# Patient Record
Sex: Male | Born: 1995 | Race: White | Hispanic: No | Smoking: Current some day smoker
Health system: Southern US, Community
[De-identification: ages and names within clinical notes are randomized; demographics above are authoritative.]

## PROBLEM LIST (undated history)

## (undated) DIAGNOSIS — F32A Depression, unspecified: Secondary | ICD-10-CM

## (undated) DIAGNOSIS — F329 Major depressive disorder, single episode, unspecified: Secondary | ICD-10-CM

## (undated) DIAGNOSIS — F1120 Opioid dependence, uncomplicated: Secondary | ICD-10-CM

---
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
## 2015-02-14 ENCOUNTER — Encounter (HOSPITAL_COMMUNITY): Payer: Self-pay | Admitting: *Deleted

## 2015-02-14 ENCOUNTER — Emergency Department (HOSPITAL_COMMUNITY)
Admission: EM | Admit: 2015-02-14 | Discharge: 2015-02-14 | Disposition: A | Discharge status: Home / Self Care | Payer: Self-pay | Attending: Emergency Medicine | Admitting: Emergency Medicine

## 2015-02-14 DIAGNOSIS — Z720 Tobacco use: Secondary | ICD-10-CM | POA: Insufficient documentation

## 2015-02-14 DIAGNOSIS — Y998 Other external cause status: Secondary | ICD-10-CM | POA: Insufficient documentation

## 2015-02-14 DIAGNOSIS — Y9289 Other specified places as the place of occurrence of the external cause: Secondary | ICD-10-CM | POA: Insufficient documentation

## 2015-02-14 DIAGNOSIS — S3992XA Unspecified injury of lower back, initial encounter: Secondary | ICD-10-CM | POA: Insufficient documentation

## 2015-02-14 DIAGNOSIS — S022XXA Fracture of nasal bones, initial encounter for closed fracture: Secondary | ICD-10-CM | POA: Insufficient documentation

## 2015-02-14 DIAGNOSIS — Y9389 Activity, other specified: Secondary | ICD-10-CM | POA: Insufficient documentation

## 2015-02-14 MED ORDER — IBUPROFEN 200 MG PO TABS
ORAL_TABLET | ORAL | Status: AC
  Filled 2015-02-14: qty 3

## 2015-02-14 MED ORDER — IBUPROFEN 200 MG PO TABS
600.0000 mg | ORAL_TABLET | Freq: Once | ORAL | Status: AC
  Administered 2015-02-14: 600 mg via ORAL
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
## 2018-01-17 ENCOUNTER — Other Ambulatory Visit: Payer: Self-pay

## 2018-01-17 ENCOUNTER — Encounter: Payer: Self-pay | Admitting: Emergency Medicine

## 2018-01-17 DIAGNOSIS — K130 Diseases of lips: Secondary | ICD-10-CM | POA: Insufficient documentation

## 2018-01-17 DIAGNOSIS — F17200 Nicotine dependence, unspecified, uncomplicated: Secondary | ICD-10-CM | POA: Insufficient documentation

## 2018-01-17 NOTE — ED Triage Notes (Signed)
Patient ambulatory to triage with steady gait, without difficulty or distress noted; pt reports that he popped a zit on his lower lip 2 days ago, now with swelling

## 2018-01-18 ENCOUNTER — Emergency Department
Admission: EM | Admit: 2018-01-18 | Discharge: 2018-01-18 | Disposition: A | Discharge status: Home / Self Care | Payer: Self-pay | Attending: Emergency Medicine | Admitting: Emergency Medicine

## 2018-01-18 DIAGNOSIS — K130 Diseases of lips: Secondary | ICD-10-CM

## 2018-01-18 MED ORDER — CEFTRIAXONE SODIUM 1 G IJ SOLR
1.0000 g | Freq: Once | INTRAMUSCULAR | Status: AC
  Administered 2018-01-18: 1 g via INTRAMUSCULAR
  Filled 2018-01-18: qty 10

## 2018-01-18 MED ORDER — SULFAMETHOXAZOLE-TRIMETHOPRIM 800-160 MG PO TABS: 1.0000 | ORAL_TABLET | Freq: Two times a day (BID) | ORAL | 0 refills | Status: DC

## 2018-01-18 MED ORDER — LIDOCAINE HCL (PF) 1 % IJ SOLN
1.2000 mL | Freq: Once | INTRAMUSCULAR | Status: AC
  Administered 2018-01-18: 1.2 mL
  Filled 2018-01-18: qty 5

## 2018-01-18 MED ORDER — NAPROXEN 500 MG PO TABS: 500.0000 mg | ORAL_TABLET | Freq: Two times a day (BID) | ORAL | 0 refills | Status: DC

## 2018-01-18 MED ORDER — NAPROXEN 500 MG PO TABS
500.0000 mg | ORAL_TABLET | Freq: Once | ORAL | Status: AC
  Administered 2018-01-18: 500 mg via ORAL
  Filled 2018-01-18: qty 1

## 2018-01-18 NOTE — Discharge Instructions (Signed)
Return to the ER for symptoms that are not improving over the next 2 days.

## 2018-01-18 NOTE — ED Provider Notes (Signed)
[facility] Emergency Department Provider Note  ____________________________________________  Time seen: Approximately 0146 PM  I have reviewed the triage vital signs and the nursing notes.   HISTORY  Chief Complaint Oral Swelling   HPI James Holloway is a 22 y.o. male who presents to the emergency department for treatment and evaluation of lower lip swelling.  He states that he had a pimple on his lower lip a couple of days ago and he "popped it."  He noticed that his lower lip was swollen yesterday but over the past 24 hours it has significantly worsened.  No specific intervention has been attempted prior to arrival.  No known fever.   History reviewed. No pertinent past medical history.  There are no active problems to display for this patient.   History reviewed. No pertinent surgical history.  Prior to Admission medications   Medication Sig Start Date End Date Taking? Authorizing Provider  ibuprofen (ADVIL,MOTRIN) 200 MG tablet Take 600-800 mg by mouth every 6 (six) hours as needed for headache, mild pain or moderate pain.    [provider]  naproxen (NAPROSYN) 500 MG tablet Take 1 tablet (500 mg total) by mouth 2 (two) times daily with a meal. 01/18/18   Adelis Docter B, FNP  sulfamethoxazole-trimethoprim (BACTRIM DS,SEPTRA DS) 800-160 MG tablet Take 1 tablet by mouth 2 (two) times daily. 01/18/18   Chinita Pester, FNP    Allergies Patient has no known allergies.  No family history on file.  Social History Social History   Tobacco Use  . Smoking status: Current Some Day Smoker  . Smokeless tobacco: Never Used  Substance Use Topics  . Alcohol use: Yes  . Drug use: No    Review of Systems  Constitutional: Negative for fever. Respiratory: Negative for cough or shortness of breath.  Musculoskeletal: Negative for myalgias Skin: Positive for lower lip swelling. Neurological: Negative for numbness or  paresthesias. ____________________________________________   PHYSICAL EXAM:  VITAL SIGNS: ED Triage Vitals  Enc Vitals Group     BP 01/17/18 2251 114/75     Pulse Rate 01/17/18 2251 100     Resp 01/17/18 2251 17     Temp 01/17/18 2251 98.9 F (37.2 C)     Temp Source 01/17/18 2251 Oral     SpO2 01/17/18 2251 97 %     Weight 01/17/18 2252 180 lb (81.6 kg)     Height 01/17/18 2252 6\' 1"  (1.854 m)     Head Circumference --      Peak Flow --      Pain Score 01/17/18 2253 9     Pain Loc --      Pain Edu? --      Excl. in GC? --      Constitutional: Well appearing. Eyes: Conjunctivae are clear without discharge or drainage. Nose: No rhinorrhea noted. Mouth/Throat: Airway is patent.  Neck: No stridor. Unrestricted range of motion observed. Cardiovascular: Capillary refill is <3 seconds.  Respiratory: Respirations are even and unlabored.. Musculoskeletal: Unrestricted range of motion observed. Neurologic: Awake, alert, and oriented x 4.  Skin: Lower lip is indurated without any area of fluctuance.  Scabbed lesion just below the lip is noted  ____________________________________________   LABS (all labs ordered are listed, but only abnormal results are displayed)  Labs Reviewed - No data to display ____________________________________________  EKG  Not indicated. ____________________________________________  RADIOLOGY  Not indicated ____________________________________________   PROCEDURES  Procedures ____________________________________________   INITIAL IMPRESSION / ASSESSMENT AND  PLAN / ED COURSE  Cristopher EstimableChristopher J Holloway is a 22 y.o. male who presents to the emergency department for treatment and evaluation of lower lip swelling.  While in the emergency department, he was given an injection of Rocephin and then will be discharged home with a prescription for Bactrim and Naprosyn.  Patient was strongly encouraged to return to the emergency department  immediately if he feels that the swelling is spreading or if he begins to have any difficulty talking, swallowing, or breathing.   Medications  cefTRIAXone (ROCEPHIN) injection 1 g (1 g Intramuscular Given 01/18/18 0127)  lidocaine (PF) (XYLOCAINE) 1 % injection 1.2 mL (1.2 mLs Other Given 01/18/18 0127)  naproxen (NAPROSYN) tablet 500 mg (500 mg Oral Given 01/18/18 0127)     Pertinent labs & imaging results that were available during my care of the patient were reviewed by me and considered in my medical decision making (see chart for details).  ____________________________________________   FINAL CLINICAL IMPRESSION(S) / ED DIAGNOSES  Final diagnoses:  Cellulitis of skin of lip    ED Discharge Orders        Ordered    sulfamethoxazole-trimethoprim (BACTRIM DS,SEPTRA DS) 800-160 MG tablet  2 times daily     01/18/18 0113    naproxen (NAPROSYN) 500 MG tablet  2 times daily with meals     01/18/18 0113       Note:  This document was prepared using Dragon voice recognition software and may include unintentional dictation errors.    Chinita Pesterriplett, Elize Pinon B, FNP 01/18/18 2025    Rebecka ApleyWebster, Allison P, MD 01/23/18 2330

## 2018-03-21 ENCOUNTER — Emergency Department
Admission: EM | Admit: 2018-03-21 | Discharge: 2018-03-21 | Disposition: A | Discharge status: Home / Self Care | Payer: Self-pay | Attending: Emergency Medicine | Admitting: Emergency Medicine

## 2018-03-21 ENCOUNTER — Other Ambulatory Visit: Payer: Self-pay

## 2018-03-21 ENCOUNTER — Encounter: Payer: Self-pay | Admitting: Emergency Medicine

## 2018-03-21 DIAGNOSIS — Y999 Unspecified external cause status: Secondary | ICD-10-CM | POA: Insufficient documentation

## 2018-03-21 DIAGNOSIS — X17XXXA Contact with hot engines, machinery and tools, initial encounter: Secondary | ICD-10-CM | POA: Insufficient documentation

## 2018-03-21 DIAGNOSIS — T310 Burns involving less than 10% of body surface: Secondary | ICD-10-CM | POA: Insufficient documentation

## 2018-03-21 DIAGNOSIS — Y929 Unspecified place or not applicable: Secondary | ICD-10-CM | POA: Insufficient documentation

## 2018-03-21 DIAGNOSIS — T23161A Burn of first degree of back of right hand, initial encounter: Secondary | ICD-10-CM | POA: Insufficient documentation

## 2018-03-21 DIAGNOSIS — Y9389 Activity, other specified: Secondary | ICD-10-CM | POA: Insufficient documentation

## 2018-03-21 DIAGNOSIS — F17210 Nicotine dependence, cigarettes, uncomplicated: Secondary | ICD-10-CM | POA: Insufficient documentation

## 2018-03-21 MED ORDER — SILVER SULFADIAZINE 1 % EX CREA: TOPICAL_CREAM | CUTANEOUS | 0 refills | Status: DC

## 2018-03-21 MED ORDER — LIDOCAINE 4 % EX CREA
TOPICAL_CREAM | Freq: Once | CUTANEOUS | Status: AC
  Administered 2018-03-21: 23:00:00 via TOPICAL
  Filled 2018-03-21: qty 5

## 2018-03-21 MED ORDER — TRAMADOL HCL 50 MG PO TABS: 50.0000 mg | ORAL_TABLET | Freq: Two times a day (BID) | ORAL | 0 refills | Status: DC
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
## 2018-03-21 NOTE — Discharge Instructions (Addendum)
Keep the hand clean, dry, and covered. Change the dressing daily. Take the pain medicine if needed. Consider using an OTC lidocaine cream for topical pain relief. [facility] for [facility].
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
## 2018-07-12 ENCOUNTER — Other Ambulatory Visit: Payer: Self-pay

## 2018-07-12 ENCOUNTER — Emergency Department: Payer: Self-pay

## 2018-07-12 ENCOUNTER — Emergency Department
Admission: EM | Admit: 2018-07-12 | Discharge: 2018-07-12 | Disposition: A | Discharge status: Home / Self Care | Payer: Self-pay | Attending: Emergency Medicine | Admitting: Emergency Medicine

## 2018-07-12 DIAGNOSIS — F1920 Other psychoactive substance dependence, uncomplicated: Secondary | ICD-10-CM | POA: Insufficient documentation

## 2018-07-12 DIAGNOSIS — F17200 Nicotine dependence, unspecified, uncomplicated: Secondary | ICD-10-CM | POA: Insufficient documentation

## 2018-07-12 DIAGNOSIS — T401X1A Poisoning by heroin, accidental (unintentional), initial encounter: Secondary | ICD-10-CM | POA: Insufficient documentation

## 2018-07-12 LAB — URINE DRUG SCREEN, QUALITATIVE (ARMC ONLY)
Amphetamines, Ur Screen: NOT DETECTED
BENZODIAZEPINE, UR SCRN: NOT DETECTED
Barbiturates, Ur Screen: NOT DETECTED
Cannabinoid 50 Ng, Ur ~~LOC~~: NOT DETECTED
Cocaine Metabolite,Ur ~~LOC~~: NOT DETECTED
MDMA (Ecstasy)Ur Screen: NOT DETECTED
METHADONE SCREEN, URINE: NOT DETECTED
Opiate, Ur Screen: POSITIVE — AB
Phencyclidine (PCP) Ur S: NOT DETECTED
TRICYCLIC, UR SCREEN: NOT DETECTED

## 2018-07-12 LAB — COMPREHENSIVE METABOLIC PANEL
ALT: 19 U/L (ref 0–44)
ANION GAP: 6 (ref 5–15)
AST: 29 U/L (ref 15–41)
Albumin: 4.2 g/dL (ref 3.5–5.0)
Alkaline Phosphatase: 86 U/L (ref 38–126)
BILIRUBIN TOTAL: 0.6 mg/dL (ref 0.3–1.2)
BUN: 14 mg/dL (ref 6–20)
CO2: 28 mmol/L (ref 22–32)
Calcium: 8.1 mg/dL — ABNORMAL LOW (ref 8.9–10.3)
Chloride: 105 mmol/L (ref 98–111)
Creatinine, Ser: 1.05 mg/dL (ref 0.61–1.24)
GFR calc Af Amer: >60 mL/min (ref >60)
Glucose, Bld: 161 mg/dL — ABNORMAL HIGH (ref 70–99)
Potassium: 3.3 mmol/L — ABNORMAL LOW (ref 3.5–5.1)
Sodium: 139 mmol/L (ref 135–145)
TOTAL PROTEIN: 7.2 g/dL (ref 6.5–8.1)

## 2018-07-12 LAB — ACETAMINOPHEN LEVEL

## 2018-07-12 LAB — CBC
HCT: 45.3 % (ref 39.0–52.0)
Hemoglobin: 14.8 g/dL (ref 13.0–17.0)
MCH: 29.4 pg (ref 26.0–34.0)
MCHC: 32.7 g/dL (ref 30.0–36.0)
MCV: 90.1 fL (ref 80.0–100.0)
Platelets: 213 10*3/uL (ref 150–400)
RBC: 5.03 MIL/uL (ref 4.22–5.81)
RDW: 13.2 % (ref 11.5–15.5)
WBC: 11.3 10*3/uL — ABNORMAL HIGH (ref 4.0–10.5)
nRBC: 0 % (ref 0.0–0.2)

## 2018-07-12 LAB — ETHANOL: Alcohol, Ethyl (B): <10 mg/dL (ref <10)

## 2018-07-12 LAB — SALICYLATE LEVEL

## 2018-07-12 LAB — GLUCOSE, CAPILLARY: Glucose-Capillary: 150 mg/dL — ABNORMAL HIGH (ref 70–99)

## 2018-07-12 MED ORDER — NALOXONE HCL 0.4 MG/0.4ML IJ SOAJ: INTRAMUSCULAR | 1 refills | Status: DC

## 2018-07-12 MED ORDER — NALOXONE HCL 4 MG/0.1ML NA LIQD
2.0000 | Freq: Once | NASAL | Status: AC
  Administered 2018-07-12: 2 via NASAL
  Filled 2018-07-12: qty 4

## 2018-07-12 MED ORDER — ACETAMINOPHEN 500 MG PO TABS
1000.0000 mg | ORAL_TABLET | Freq: Once | ORAL | Status: AC
  Administered 2018-07-12: 1000 mg via ORAL
  Filled 2018-07-12: qty 2

## 2018-07-12 MED ORDER — SODIUM CHLORIDE 0.9 % IV SOLN
Freq: Once | INTRAVENOUS | Status: AC
  Administered 2018-07-12: 16:00:00 via INTRAVENOUS

## 2018-07-12 MED ORDER — NALOXONE HCL 4 MG/0.1ML NA LIQD
NASAL | Status: AC
  Filled 2018-07-12: qty 4
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
## 2018-07-12 NOTE — ED Provider Notes (Signed)
[facility] Emergency Department Provider Note  ____________________________________________  Time seen: Approximately 3:49 PM  I have reviewed the triage vital signs and the nursing notes.   HISTORY  Chief Complaint Drug Overdose    HPI James Holloway is a 22 y.o. male with no significant past medical history who is brought to the ED after an apparent heroin overdose.  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  However, today he relapsed for the first time.  He was found unresponsive by his father who performed CPR in the home prior to first responders arriving.  On their arrival, they found the patient to have a pulse with a respiratory rate of 6 and tachycardia.  No Narcan was given.  He was found with a needle in his arm according to report.  The patient denies any medical complaints preceding this event.  He denies any chest pain or shortness of breath or palpitations.  He reports that he feels dehydrated and "sluggish".      History reviewed. No pertinent past medical history.   There are no active problems to display for this patient.    History reviewed. No pertinent surgical history.   Prior to Admission medications   Medication Sig Start Date End Date Taking? Authorizing Provider  Naloxone HCl 0.4 MG/0.4ML SOAJ Follow package instructions for heroin or opioid overdose. 07/12/18   Sharman Cheek, MD  silver sulfADIAZINE (SILVADENE) 1 % cream Apply to affected area daily 03/21/18   Menshew, Charlesetta Ivory, PA-C  traMADol (ULTRAM) 50 MG tablet Take 1 tablet (50 mg total) by mouth 2 (two) times daily. 03/21/18   Menshew, Charlesetta Ivory, PA-C     Allergies Patient has no known allergies.   No family history on file.  Social History Social History   Tobacco  Use  . Smoking status: Current Some Day Smoker  . Smokeless tobacco: Never Used  Substance Use Topics  . Alcohol use: Never    Frequency: Never  . Drug use: Yes    Types: IV    Comment: heroin    Review of Systems  Constitutional:   No fever or chills.  Cardiovascular:   No chest pain or syncope. Respiratory:   No dyspnea or cough. Gastrointestinal:   Negative for abdominal pain, vomiting and diarrhea.  Musculoskeletal:   Negative for focal pain or swelling All other systems reviewed and are negative except as documented above in ROS and HPI.  ____________________________________________   PHYSICAL EXAM:  VITAL SIGNS: ED Triage Vitals  Enc Vitals Group     BP 07/12/18 1515 (!) 149/86     Pulse Rate 07/12/18 1515 (!) 123     Resp 07/12/18 1515 13     Temp 07/12/18 1518 98.7 F (37.1 C)     Temp Source 07/12/18 1518 Oral     SpO2 07/12/18 1515 93 %     Weight 07/12/18 1514 160 lb (72.6 kg)     Height 07/12/18 1514 6\' 1"  (1.854 m)     Head Circumference --      Peak Flow --      Pain Score 07/12/18 1514 0     Pain Loc --      Pain Edu? --      Excl. in GC? --     Vital signs reviewed, nursing assessments  reviewed.   Constitutional:   Alert and oriented. Non-toxic appearance. Eyes:   Conjunctivae are normal. EOMI. PERRL. ENT      Head:   Normocephalic and atraumatic.      Nose:   No congestion/rhinnorhea.       Mouth/Throat:   Dry mucous membranes, no pharyngeal erythema. No peritonsillar mass.       Neck:   No meningismus. Full ROM. Hematological/Lymphatic/Immunilogical:   No cervical lymphadenopathy. Cardiovascular:   Tachycardia heart rate 105. Symmetric bilateral radial and DP pulses.  No murmurs. Cap refill less than 2 seconds. Respiratory:   Normal respiratory effort without tachypnea/retractions. Breath sounds are clear and equal bilaterally. No wheezes/rales/rhonchi. Gastrointestinal:   Soft and nontender. Non distended. There is no CVA tenderness.  No  rebound, rigidity, or guarding. Musculoskeletal:   Normal range of motion in all extremities. No joint effusions.  No lower extremity tenderness.  No edema.  No inflammatory changes or tenderness in the area of injection. Neurologic:   Normal speech and language.  Motor grossly intact. No acute focal neurologic deficits are appreciated.  Skin:    Skin is warm, dry and intact.  Long track marks on the right forearm from previous injections.  No rash noted.  No petechiae, purpura, or bullae.  ____________________________________________    LABS (pertinent positives/negatives) (all labs ordered are listed, but only abnormal results are displayed) Labs Reviewed  COMPREHENSIVE METABOLIC PANEL - Abnormal; Notable for the following components:      Result Value   Potassium 3.3 (*)    Glucose, Bld 161 (*)    Calcium 8.1 (*)    All other components within normal limits  ACETAMINOPHEN LEVEL - Abnormal; Notable for the following components:   Acetaminophen (Tylenol), Serum <10 (*)    All other components within normal limits  CBC - Abnormal; Notable for the following components:   WBC 11.3 (*)    All other components within normal limits  URINE DRUG SCREEN, QUALITATIVE ([facility] ONLY) - Abnormal; Notable for the following components:   Opiate, Ur Screen POSITIVE (*)    All other components within normal limits  GLUCOSE, CAPILLARY - Abnormal; Notable for the following components:   Glucose-Capillary 150 (*)    All other components within normal limits  ETHANOL  SALICYLATE LEVEL  CBG MONITORING, ED   ____________________________________________   EKG  Interpreted by me Sinus tachycardia rate 108, right axis, normal intervals.  Normal QRS ST segments and T waves.  ____________________________________________    RADIOLOGY  Dg Chest 2 View  Result Date: 07/12/2018 CLINICAL DATA:  22 year old who sustained a heroin overdose, found unresponsive at home. Respiratory rate of 6 and  tachycardia with a heart rate of 130-140 upon EMS arrival. EXAM: CHEST - 2 VIEW COMPARISON:  None. FINDINGS: Cardiac silhouette upper normal in size to mildly enlarged for patient age. Hilar and mediastinal contours otherwise unremarkable. Lungs clear. Bronchovascular markings normal. Pulmonary vascularity normal. No visible pleural effusions. No pneumothorax. Visualized bony thorax intact. IMPRESSION: Borderline to mild cardiomegaly for patient age. No acute cardiopulmonary disease. Electronically Signed   By: Hulan Saas M.D.   On: 07/12/2018 16:23    ____________________________________________   PROCEDURES Procedures  ____________________________________________    CLINICAL IMPRESSION / ASSESSMENT AND PLAN / ED COURSE  Pertinent labs & imaging results that were available during my care of the patient were reviewed by me and considered in my medical decision making (see chart for details).    Patient presents after an apparent heroin overdose.  Reportedly he had a potential cardiac arrest requiring CPR resuscitation in home.  Currently denies any complaints.  He is forthcoming and is clear that this is a accidental overdose without any intent to harm himself.  Poses no specific danger to himself and is not committable at this time.  Check labs and chest x-ray, IV fluids for hydration, will monitor in the ED pending work-up.  ----------------------------------------- 7:47 PM on 07/12/2018 -----------------------------------------  Work-up chest x-ray vital signs all unremarkable.  Heart rate normalized after IV fluids.  On reassessment patient is calm and comfortable.  Given the presentation I doubt a true cardiac arrest, and doubt the patient would be increased risk for dysrhythmia.  Will provide naloxone to take home if available, otherwise a prescription.  Patient indicates that he will follow-up with [facility] to restart Suboxone therapy tomorrow.  Patient informed he is always  welcome to return to the emergency room if he has any safety concerns or other symptoms.      ____________________________________________   FINAL CLINICAL IMPRESSION(S) / ED DIAGNOSES    Final diagnoses:  Accidental overdose of heroin, initial encounter [facility](HCC)     ED Discharge Orders         Ordered    Naloxone HCl 0.4 MG/0.4ML SOAJ     07/12/18 1947          Portions of this note were generated with dragon dictation software. Dictation errors may occur despite best attempts at proofreading.    Sharman CheekStafford, Danella Philson, MD 07/12/18 1949

## 2018-07-12 NOTE — BH Assessment (Signed)
Assessment Note  James EstimableChristopher J Holloway is an 22 y.o. male who presents to ED after being found by his father unresponsive after overdosing from heroin. Pt adamantly denies this overdose being intentional. He reports past SA treatment with [facility] in September 2019. He reports previous MAT with [facility]. Pt is not currently interested in inpatient detox Tx. He is willing to engage in SA outpatient services. TTS provided pt with community resources.   Diagnosis: Opioid Use Disorder, Severe  Past Medical History: History reviewed. No pertinent past medical history.  History reviewed. No pertinent surgical history.  Family History: No family history on file.  Social History:  reports that he has been smoking. He has never used smokeless tobacco. He reports that he has current or past drug history. Drug: IV. He reports that he does not drink alcohol.  Additional Social History:  Alcohol / Drug Use Pain Medications: See MAR Prescriptions: See MAR Over the Counter: See MAR History of alcohol / drug use?: Yes Longest period of sobriety (when/how long): Unable to report Negative Consequences of Use: Personal relationships Substance #1 Name of Substance 1: Heroin 1 - Age of First Use: Unable to recall 1 - Amount (size/oz): "just a needle filled" 1 - Frequency: Unable to Quantify 1 - Duration: Unable to Quantify 1 - Last Use / Amount: 07/12/2018  CIWA: CIWA-Ar BP: 130/80 Pulse Rate: 86 COWS:    Allergies: No Known Allergies  Home Medications:  (Not in a hospital admission)  OB/GYN Status:  No LMP for male patient.  General Assessment Data [facility] Assessment: In system Is this a Tele or Face-to-Face Assessment?: Face-to-Face Is this an Initial Assessment or a Re-assessment for this encounter?: Initial Assessment Patient Accompanied by:: N/A Language Other than English: No Living Arrangements: Other (Comment) What gender do you identify as?:  Male Marital status: Single Pregnancy Status: No Living Arrangements: Alone Can pt return to current living arrangement?: Yes Admission Status: Voluntary Is patient capable of signing voluntary admission?: Yes Referral Source: Self/Family/Friend Insurance type: None  Medical Screening Exam [facility]-in ONLY) Medical Exam completed: Yes  Crisis Care Plan Living Arrangements: Alone Legal Guardian: Other:(Self) Name of Psychiatrist: None Name of Therapist: None  Education Status Is patient currently in school?: No Is the patient employed, unemployed or receiving disability?: Unemployed  Risk to self with the past 6 months Suicidal Ideation: No Has patient been a risk to self within the past 6 months prior to admission? : No Suicidal Intent: No Has patient had any suicidal intent within the past 6 months prior to admission? : No Is patient at risk for suicide?: No Suicidal Plan?: No Has patient had any suicidal plan within the past 6 months prior to admission? : No Access to Means: No What has been your use of drugs/alcohol within the last 12 months?: Heroin Previous Attempts/Gestures: No How many times?: 0 Other Self Harm Risks: None Triggers for Past Attempts: None known Intentional Self Injurious Behavior: None Family Suicide History: No Recent stressful life event(s): (Active Addiction) Persecutory voices/beliefs?: No Depression: Yes Depression Symptoms: Insomnia Substance abuse history and/or treatment for substance abuse?: Yes Suicide prevention information given to non-admitted patients: Not applicable  Risk to Others within the past 6 months Homicidal Ideation: No Does patient have any lifetime risk of violence toward others beyond the six months prior to admission? : No Thoughts of Harm to Others: No Current Homicidal Intent: No Current Homicidal Plan: No Access to Homicidal Means: No Identified  Victim: None History of harm to others?: No Assessment of  Violence: None Noted Violent Behavior Description: None Does patient have access to weapons?: No Criminal Charges Pending?: No Does patient have a court date: No Is patient on probation?: No  Psychosis Hallucinations: None noted Delusions: None noted  Mental Status Report Appearance/Hygiene: (Street clothes) Eye Contact: Good Motor Activity: Freedom of movement Speech: Logical/coherent Level of Consciousness: Other (Comment)("Cloudy") Mood: Depressed Affect: Flat Anxiety Level: Minimal Thought Processes: Coherent, Relevant Judgement: Partial Orientation: Person, Time, Place, Situation, Appropriate for developmental age Obsessive Compulsive Thoughts/Behaviors: None  Cognitive Functioning Concentration: Normal Memory: Recent Intact, Remote Intact Is patient IDD: No Insight: Fair Impulse Control: Fair Appetite: Good Have you had any weight changes? : No Change Sleep: Decreased Total Hours of Sleep: 5 Vegetative Symptoms: None  ADLScreening [facility] Assessment Services) Patient's cognitive ability adequate to safely complete daily activities?: Yes Patient able to express need for assistance with ADLs?: Yes Independently performs ADLs?: Yes (appropriate for developmental age)  Prior Inpatient Therapy Prior Inpatient Therapy: No  Prior Outpatient Therapy Prior Outpatient Therapy: Yes Prior Therapy Dates: September 2019 Prior Therapy Facilty/Provider(s): [facility] Reason for Treatment: Detox Does patient have an ACCT team?: No Does patient have Intensive In-House Services?  : No Does patient have [facility] services? : No Does patient have P4CC services?: No  ADL Screening (condition at time of admission) Patient's cognitive ability adequate to safely complete daily activities?: Yes Patient able to express need for assistance with ADLs?: Yes Independently performs ADLs?: Yes (appropriate for developmental age)       Abuse/Neglect Assessment (Assessment to be  complete while patient is alone) Abuse/Neglect Assessment Can Be Completed: Yes Physical Abuse: Denies Verbal Abuse: Denies Sexual Abuse: Denies Exploitation of patient/patient's resources: Denies Self-Neglect: Denies Values / Beliefs Cultural Requests During Hospitalization: None Spiritual Requests During Hospitalization: None Consults Spiritual Care Consult Needed: No Social Work Consult Needed: No Merchant navy officer (For Healthcare) Does Patient Have a Medical Advance Directive?: No Would patient like information on creating a medical advance directive?: No - Patient declined       Child/Adolescent Assessment Running Away Risk: (Patient is an adult)  Disposition:  Disposition Initial Assessment Completed for this Encounter: Yes Disposition of Patient: Discharge Patient refused recommended treatment: No Mode of transportation if patient is discharged/movement?: Car Patient referred to: Outpatient clinic referral  On Site Evaluation by:   Reviewed with Physician:    Wilmon Arms 07/12/2018 7:33 PM

## 2018-07-12 NOTE — Discharge Instructions (Addendum)
You should avoid any heroin use whatsoever, but as a precaution, you should also keep naloxone nasal spray or injection in an accessible location in your home.  This will allow friends or family to rescue you in the event of a future overdose.

## 2018-07-12 NOTE — ED Notes (Signed)
Tylenol given for headach pain 4/10.

## 2018-07-12 NOTE — ED Notes (Signed)
ED MD at the bedside

## 2018-07-12 NOTE — ED Notes (Signed)
[employer] officer at the bedside to interview pt. Pt tells LE he has a needle in his pocket. Pt gave to this RN and disposed of in sharps container.

## 2018-07-12 NOTE — ED Notes (Signed)
TTS with patient. 

## 2018-07-12 NOTE — ED Notes (Signed)
Pt back from xray. Mom at the bedside.

## 2018-11-06 ENCOUNTER — Emergency Department
Admission: EM | Admit: 2018-11-06 | Discharge: 2018-11-06 | Disposition: A | Discharge status: Home / Self Care | Payer: Self-pay | Attending: Emergency Medicine | Admitting: Emergency Medicine

## 2018-11-06 ENCOUNTER — Other Ambulatory Visit: Payer: Self-pay

## 2018-11-06 ENCOUNTER — Emergency Department: Payer: Self-pay

## 2018-11-06 DIAGNOSIS — F17200 Nicotine dependence, unspecified, uncomplicated: Secondary | ICD-10-CM | POA: Insufficient documentation

## 2018-11-06 DIAGNOSIS — T401X1A Poisoning by heroin, accidental (unintentional), initial encounter: Secondary | ICD-10-CM | POA: Insufficient documentation

## 2018-11-06 DIAGNOSIS — Z79899 Other long term (current) drug therapy: Secondary | ICD-10-CM | POA: Insufficient documentation

## 2018-11-06 MED ORDER — NALOXONE HCL 2 MG/2ML IJ SOSY
0.4000 mg | PREFILLED_SYRINGE | INTRAMUSCULAR | Status: DC | PRN
  Filled 2018-11-06: qty 2

## 2018-11-06 MED ORDER — NALOXONE HCL 4 MG/0.1ML NA LIQD
1.0000 | Freq: Once | NASAL | Status: AC
  Administered 2018-11-06: 1 via NASAL
  Filled 2018-11-06: qty 8

## 2018-11-06 MED ORDER — NALOXONE HCL 4 MG/0.1ML NA LIQD: 1.0000 | Freq: Once | NASAL | Status: DC

## 2018-11-06 NOTE — Discharge Instructions (Addendum)
Call your support program as soon as possible and follow up with [facility] or other substance abuse treatment program as soon as possible for continued help maintaining your opiate sobriety.  Keep a naloxone injector with you at all times so that a bystander can give you rescue medication in the event of overdose.

## 2018-11-06 NOTE — ED Triage Notes (Signed)
Pt comes EMS after mom found him in his house donw. Pt OD on heroin. Pt had agonal respirations with EMS. 1mg  narcan given with fire. Pt denied shooting up but needle was found . Pt was on suboxone for 4 months clean. Pt was htn with 160/90 with HR of 115. Pt was ambulatory for EMS to truck. Pt denies SI/HI at this time.

## 2018-11-06 NOTE — ED Provider Notes (Signed)
[facility] Emergency Department Provider Note  ____________________________________________  Time seen: Approximately 6:20 PM  I have reviewed the triage vital signs and the nursing notes.   HISTORY  Chief Complaint Drug Overdose    HPI James Holloway is a 23 y.o. male with a history of heroin abuse who was found by his mom unresponsive at home with a needle nearby.  Patient was given a milligram of Narcan by fire department with arousal.  He reported injecting 10 mL of heroin solution.  This is his first use in the past 4 months during which she had been clean and in remission on a Suboxone program.  He describes this as a "slip" and denies any intent to harm himself.  No SI HI or hallucinations.  Denies any other recent symptoms or illness.  Eating and drinking normally.  Denies alcohol or other drug use currently.      History reviewed. No pertinent past medical history.   There are no active problems to display for this patient.    History reviewed. No pertinent surgical history.   Prior to Admission medications   Medication Sig Start Date End Date Taking? Authorizing Provider  Naloxone HCl 0.4 MG/0.4ML SOAJ Follow package instructions for heroin or opioid overdose. 07/12/18   Sharman Cheek, MD  silver sulfADIAZINE (SILVADENE) 1 % cream Apply to affected area daily 03/21/18   Menshew, Charlesetta Ivory, PA-C  traMADol (ULTRAM) 50 MG tablet Take 1 tablet (50 mg total) by mouth 2 (two) times daily. 03/21/18   Menshew, Charlesetta Ivory, PA-C     Allergies Patient has no known allergies.   History reviewed. No pertinent family history.  Social History Social History   Tobacco Use  . Smoking status: Current Some Day Smoker  . Smokeless tobacco: Never Used  Substance Use Topics  . Alcohol use: Never    Frequency: Never  . Drug use: Yes    Types: IV    Comment: heroin    Review of Systems  Constitutional:   No fever or chills.   ENT:   No sore throat. No rhinorrhea. Cardiovascular:   No chest pain or syncope. Respiratory:   No dyspnea or cough. Gastrointestinal:   Negative for abdominal pain, vomiting and diarrhea.  Musculoskeletal:   Negative for focal pain or swelling All other systems reviewed and are negative except as documented above in ROS and HPI.  ____________________________________________   PHYSICAL EXAM:  VITAL SIGNS: ED Triage Vitals  Enc Vitals Group     BP 11/06/18 1736 114/82     Pulse Rate 11/06/18 1736 (!) 101     Resp 11/06/18 1736 12     Temp 11/06/18 1736 98.4 F (36.9 C)     Temp Source 11/06/18 1736 Oral     SpO2 11/06/18 1736 95 %     Weight 11/06/18 1735 165 lb (74.8 kg)     Height 11/06/18 1735  (1.854 m)     Head Circumference --      Peak Flow --      Pain Score 11/06/18 1733 0     Pain Loc --      Pain Edu? --      Excl. in GC? --     Vital signs reviewed, nursing assessments reviewed.   Constitutional:   Alert and oriented. Non-toxic appearance. Eyes:   Conjunctivae are normal. EOMI. PERRL. ENT      Head:   Normocephalic and atraumatic.  Nose:   No congestion/rhinnorhea.       Mouth/Throat:   MMM, no pharyngeal erythema. No peritonsillar mass.       Neck:   No meningismus. Full ROM. Hematological/Lymphatic/Immunilogical:   No cervical lymphadenopathy. Cardiovascular:   Tachycardia heart rate 100. Symmetric bilateral radial and DP pulses.  No murmurs. Cap refill less than 2 seconds. Respiratory:   Normal respiratory effort without tachypnea/retractions. Breath sounds are clear and equal bilaterally. No wheezes/rales/rhonchi. Gastrointestinal:   Soft and nontender. Non distended. There is no CVA tenderness.  No rebound, rigidity, or guarding.  Musculoskeletal:   Normal range of motion in all extremities. No joint effusions.  No lower extremity tenderness.  No edema. Neurologic:   Normal speech and language.  Motor grossly intact. No acute focal  neurologic deficits are appreciated.  Skin:    Skin is warm, dry and intact. No rash noted.  No petechiae, purpura, or bullae.  Track marks on right forearm without swelling or inflammatory changes  ____________________________________________    LABS (pertinent positives/negatives) (all labs ordered are listed, but only abnormal results are displayed) Labs Reviewed - No data to display ____________________________________________   EKG  Interpreted by me Sinus tachycardia rate 100, right axis, normal intervals.  Normal QRS ST segments and T waves.  No right heart strain.  ____________________________________________    RADIOLOGY  Dg Chest Portable 1 View  Result Date: 11/06/2018 CLINICAL DATA:  Smoker, coarse breath sounds EXAM: PORTABLE CHEST 1 VIEW COMPARISON:  07/12/2018 FINDINGS: The heart size and mediastinal contours are within normal limits. Both lungs are clear. The visualized skeletal structures are unremarkable. IMPRESSION: No active disease. Electronically Signed   By: Elige Ko   On: 11/06/2018 17:59    ____________________________________________   PROCEDURES Procedures  ____________________________________________    CLINICAL IMPRESSION / ASSESSMENT AND PLAN / ED COURSE  Medications ordered in the ED: Medications  naloxone (NARCAN) injection 0.4 mg (has no administration in time range)    Pertinent labs & imaging results that were available during my care of the patient were reviewed by me and considered in my medical decision making (see chart for details).  James Holloway was evaluated in Emergency Department on 11/06/2018 for the symptoms described in the history of present illness. He was evaluated in the context of the global COVID-19 pandemic, which necessitated consideration that the patient might be at risk for infection with the SARS-CoV-2 virus that causes COVID-19. Institutional protocols and algorithms that pertain to the evaluation of  patients at risk for COVID-19 are in a state of rapid change based on information released by regulatory bodies including the CDC and federal and state organizations. These policies and algorithms were followed during the patient's care in the ED.     Clinical Course as of Nov 06 1818  Sun Nov 06, 2018  1740 Patient presents via EMS after being found unresponsive after IV heroin use.  Obvious track marks on the right forearm.  Recovered to normal month mental status after 1 mg Narcan given by first responders.  Currently asymptomatic, vital signs unremarkable except for slight tachycardia.  Not significantly dehydrated.  Will check chest x-ray, monitor in ED, plan for discharge.  Patient reports that he had been clean for 4 months, is in a support group, and today was a relapse.  He says that he has access to clean needles but hopefully will be able to avoid future drug use.  Not interested in additional drug treatment referral at this time.  No current evidence of pulmonary edema or endocarditis or soft tissue infection.   [PS]    Clinical Course User Index [PS] Sharman Cheek, MD     ----------------------------------------- 6:27 PM on 11/06/2018 -----------------------------------------  Chest x-ray unremarkable.  Patient remains asymptomatic and request to be discharged right away.  He has capacity, not committable, medically stable without evidence of respiratory distress or pulmonary edema.  Will discharge with naloxone rescue medication to keep on hand with him.  ____________________________________________   FINAL CLINICAL IMPRESSION(S) / ED DIAGNOSES    Final diagnoses:  Accidental overdose of heroin, initial encounter [facility])     ED Discharge Orders    None      Portions of this note were generated with dragon dictation software. Dictation errors may occur despite best attempts at proofreading.   Sharman Cheek, MD 11/06/18 1827

## 2018-11-19 ENCOUNTER — Emergency Department
Admission: EM | Admit: 2018-11-19 | Discharge: 2018-11-19 | Disposition: A | Discharge status: Home / Self Care | Payer: Self-pay | Attending: Emergency Medicine | Admitting: Emergency Medicine

## 2018-11-19 ENCOUNTER — Other Ambulatory Visit: Payer: Self-pay

## 2018-11-19 DIAGNOSIS — F17210 Nicotine dependence, cigarettes, uncomplicated: Secondary | ICD-10-CM | POA: Insufficient documentation

## 2018-11-19 DIAGNOSIS — T401X1A Poisoning by heroin, accidental (unintentional), initial encounter: Secondary | ICD-10-CM | POA: Insufficient documentation

## 2018-11-19 HISTORY — DX: Opioid dependence, uncomplicated: F11.20

## 2018-11-19 MED ORDER — NALOXONE HCL 4 MG/0.1ML NA LIQD
1.0000 | Freq: Once | NASAL | Status: AC
  Administered 2018-11-19: 1 via NASAL
  Filled 2018-11-19: qty 4

## 2018-11-19 MED ORDER — ONDANSETRON HCL 4 MG/2ML IJ SOLN
INTRAMUSCULAR | Status: AC
  Filled 2018-11-19: qty 2

## 2018-11-19 MED ORDER — ONDANSETRON HCL 4 MG/2ML IJ SOLN
4.0000 mg | INTRAMUSCULAR | Status: AC
  Administered 2018-11-19: 4 mg via INTRAVENOUS

## 2018-11-19 NOTE — ED Notes (Addendum)
MD in with patient. Patient to be observed until Narcan effects wear off.

## 2018-11-19 NOTE — ED Triage Notes (Signed)
Patient reports accidental heroin overdose. Patient reports he lost consciousness, and his mother administered narcan. Patient is groggy, but able to answer questions appropriately. Patient c/o nausea/fatigue.

## 2018-11-19 NOTE — Discharge Instructions (Addendum)
You have been seen in the Emergency Department (ED) today for substance abuse.  You have been evaluated by the ED physician(s) and/or by the behavioral medicine specialists and are being discharged with outpatient resources and follow up recommendations.  Please return to the ED immediately if you have ANY thoughts of hurting yourself or anyone else, so that we may help you.  Please avoid alcohol and drug use.  Follow up with your doctor and/or therapist as soon as possible regarding today's ED  visit.   Please follow up any other recommendations and clinic appointments provided by the psychiatry team that saw you in the Emergency Department. 

## 2018-11-19 NOTE — ED Notes (Signed)
Pt resting in bed in no acute distress 

## 2018-11-19 NOTE — ED Provider Notes (Signed)
[facility] Emergency Department Provider Note  ____________________________________________   First MD Initiated Contact with Patient 11/19/18 0221     (approximate)  I have reviewed the triage vital signs and the nursing notes.   HISTORY  Chief Complaint Drug Overdose    HPI James Holloway is a 23 y.o. male with medical history as listed below who presents by private vehicle for evaluation after what is reported as an accidental heroin overdose.  He says he is struggled with opioid addiction for a long time and he had one relapse about a month ago where he was seen in this emergency department.  He says he does not use regularly and use "just a little bit" tonight and apparently overdosed.  His mother gave him Narcan at home and told him he had to come to the emergency department.  He is currently sleepy but otherwise feels okay except for some nausea.  He denies any pain.  He denies shortness of breath.  He has had no recent illnesses including fever/chills, sore throat, cough, vomiting, abdominal pain, and he has no contact with COVID-19 patients.  He says that he did not try to kill himself, "I just wanted to get high" but he acknowledges that he does not have a tolerance for heroin anymore and he thinks he must abuse too much.  He has been through rehab before at [facility] in [place] as well as going to [facility] for outpatient rehab but he stopped going to [facility] because he did not think he needed to anymore.  His symptoms were severe and Narcan made them better, nothing in particular made them worse.         Past Medical History:  Diagnosis Date  . Opioid dependence (HCC)    heroin abuse    There are no active problems to display for this patient.   History reviewed. No pertinent surgical history.  Prior to Admission medications   Medication Sig Start Date End Date Taking? Authorizing Provider  Naloxone HCl 0.4 MG/0.4ML SOAJ Follow  package instructions for heroin or opioid overdose. 07/12/18   Sharman CheekStafford, Phillip, MD  silver sulfADIAZINE (SILVADENE) 1 % cream Apply to affected area daily 03/21/18   Menshew, Charlesetta IvoryJenise V Bacon, PA-C  traMADol (ULTRAM) 50 MG tablet Take 1 tablet (50 mg total) by mouth 2 (two) times daily. 03/21/18   Menshew, Charlesetta IvoryJenise V Bacon, PA-C    Allergies Patient has no known allergies.  History reviewed. No pertinent family history.  Social History Social History   Tobacco Use  . Smoking status: Current Some Day Smoker  . Smokeless tobacco: Never Used  Substance Use Topics  . Alcohol use: Never    Frequency: Never  . Drug use: Yes    Types: IV    Comment: heroin    Review of Systems Constitutional: No fever/chills Eyes: No visual changes. ENT: No sore throat. Cardiovascular: Denies chest pain. Respiratory: Denies shortness of breath. Gastrointestinal: No abdominal pain.  Nausea, no vomiting.  No diarrhea.  No constipation. Genitourinary: Negative for dysuria. Musculoskeletal: Negative for neck pain.  Negative for back pain. Integumentary: Negative for rash. Neurological: Negative for headaches, focal weakness or numbness. Psychiatric:  No suicidal ideation.  ____________________________________________   PHYSICAL EXAM:  VITAL SIGNS: ED Triage Vitals  Enc Vitals Group     BP 11/19/18 0218 122/74     Pulse Rate 11/19/18 0218 97     Resp 11/19/18 0218 18     Temp 11/19/18 0218  97.9 F (36.6 C)     Temp Source 11/19/18 0218 Oral     SpO2 11/19/18 0218 100 %     Weight 11/19/18 0217 74.8 kg (165 lb)     Height 11/19/18 0217 1.854 m (6\' 1" )     Head Circumference --      Peak Flow --      Pain Score 11/19/18 0217 0     Pain Loc --      Pain Edu? --      Excl. in GC? --     Constitutional: Alert and oriented.  Generally well-appearing, no acute distress. Eyes: Conjunctivae are normal. Head: Atraumatic. Nose: No congestion/rhinnorhea. Mouth/Throat: Mucous membranes are moist.  Neck: No stridor.  No meningeal signs.   Cardiovascular: Normal rate, regular rhythm. Good peripheral circulation. Grossly normal heart sounds. Respiratory: Normal respiratory effort.  No retractions. No audible wheezing. Gastrointestinal: Soft and nontender. No distention.  Musculoskeletal: No lower extremity tenderness nor edema. No gross deformities of extremities. Neurologic:  Normal speech and language. No gross focal neurologic deficits are appreciated.  Patient appears somnolent but is awake and alert and oriented. Skin:  Skin is warm, dry and intact. No rash noted.  No extensive track marks on his arms. Psychiatric: Mood and affect are normal. Speech and behavior are normal.  Adamantly denies suicidal ideation, states that this was "a mistake" and had no intention to kill himself.  ____________________________________________   LABS (all labs ordered are listed, but only abnormal results are displayed)  Labs Reviewed - No data to display ____________________________________________  EKG  No indication for EKG ____________________________________________  RADIOLOGY   ED MD interpretation: No indication for imaging  Official radiology report(s): No results found.  ____________________________________________   PROCEDURES   Procedure(s) performed (including Critical Care):  Procedures   ____________________________________________   INITIAL IMPRESSION / MDM / ASSESSMENT AND PLAN / ED COURSE  As part of my medical decision making, I reviewed the following data within the electronic MEDICAL RECORD NUMBER History obtained from family, Old chart reviewed, Notes from prior ED visits and [place] Controlled Substance Database  CAMERINO LAZER was evaluated in Emergency Department on 11/19/2018 for the symptoms described in the history of present illness. He was evaluated in the context of the global COVID-19 pandemic, which necessitated consideration that the patient might  be at risk for infection with the SARS-CoV-2 virus that causes COVID-19. Institutional protocols and algorithms that pertain to the evaluation of patients at risk for COVID-19 are in a state of rapid change based on information released by regulatory bodies including the CDC and federal and state organizations. These policies and algorithms were followed during the patient's care in the ED.      Differential diagnosis includes, but is not limited to, accidental heroin overdose, intentional overdose with suicidal ideation, metabolic or electrolyte derangement, infectious process, other medication side effect.  The patient is awake and alert, protecting his airway, but received Narcan at home.  We will observe him for a couple of hours to make sure he is stable.  He is on a pulse oximeter and his vital signs are stable.  No indication for lab work.  The patient does not meet any criteria for involuntary commitment and does not need a psychiatric consultation.  I had an extensive conversation with him about his risk of dying or being permanently disabled if he continues to "slip up" and I encouraged him to go back to [place] for outpatient counseling.  I am  also providing him with additional resources for following up as an outpatient for substance abuse.  He states that he understands.  Clinical Course as of Nov 18 645  Sat Nov 19, 2018  1610 The patient has been sleeping comfortably and has been observed for 4 and half hours.  He awakens easily to soft voice and is in no distress.  He is appropriate for discharge and outpatient follow-up at this time.  I will give him a dose of Narcan to take with him as per pharmacy policy. I gave my usual customary return precautions.   [CF]    Clinical Course User Index [CF] Loleta Rose, MD    ____________________________________________  FINAL CLINICAL IMPRESSION(S) / ED DIAGNOSES  Final diagnoses:  Accidental overdose of heroin, initial encounter [facility])      MEDICATIONS GIVEN DURING THIS VISIT:  Medications  naloxone (NARCAN) nasal spray 4 mg/0.1 mL (has no administration in time range)  ondansetron (ZOFRAN) injection 4 mg (4 mg Intravenous Given 11/19/18 0249)     ED Discharge Orders    None       Note:  This document was prepared using Dragon voice recognition software and may include unintentional dictation errors.   Loleta Rose, MD 11/19/18 [phone]

## 2018-11-22 ENCOUNTER — Other Ambulatory Visit: Payer: Self-pay

## 2018-11-22 ENCOUNTER — Inpatient Hospital Stay
Admission: AD | Admit: 2018-11-22 | Discharge: 2018-11-24 | DRG: 897 | Disposition: A | Discharge status: Home / Self Care | Payer: No Typology Code available for payment source | Source: Intra-hospital | Attending: Psychiatry | Admitting: Psychiatry

## 2018-11-22 ENCOUNTER — Encounter: Payer: Self-pay | Admitting: Psychiatry

## 2018-11-22 ENCOUNTER — Emergency Department
Admission: EM | Admit: 2018-11-22 | Discharge: 2018-11-22 | Disposition: A | Discharge status: Other Acute Inpatient Hospital | Payer: No Typology Code available for payment source | Attending: Emergency Medicine | Admitting: Emergency Medicine

## 2018-11-22 DIAGNOSIS — F329 Major depressive disorder, single episode, unspecified: Secondary | ICD-10-CM | POA: Insufficient documentation

## 2018-11-22 DIAGNOSIS — F1110 Opioid abuse, uncomplicated: Secondary | ICD-10-CM | POA: Diagnosis present

## 2018-11-22 DIAGNOSIS — T401X2A Poisoning by heroin, intentional self-harm, initial encounter: Secondary | ICD-10-CM | POA: Diagnosis not present

## 2018-11-22 DIAGNOSIS — T887XXA Unspecified adverse effect of drug or medicament, initial encounter: Secondary | ICD-10-CM | POA: Insufficient documentation

## 2018-11-22 DIAGNOSIS — Z91120 Patient's intentional underdosing of medication regimen due to financial hardship: Secondary | ICD-10-CM | POA: Diagnosis not present

## 2018-11-22 DIAGNOSIS — T507X6A Underdosing of analeptics and opioid receptor antagonists, initial encounter: Secondary | ICD-10-CM | POA: Diagnosis present

## 2018-11-22 DIAGNOSIS — Y69 Unspecified misadventure during surgical and medical care: Secondary | ICD-10-CM | POA: Insufficient documentation

## 2018-11-22 DIAGNOSIS — F1994 Other psychoactive substance use, unspecified with psychoactive substance-induced mood disorder: Secondary | ICD-10-CM | POA: Diagnosis not present

## 2018-11-22 DIAGNOSIS — F1120 Opioid dependence, uncomplicated: Secondary | ICD-10-CM | POA: Diagnosis present

## 2018-11-22 DIAGNOSIS — F332 Major depressive disorder, recurrent severe without psychotic features: Secondary | ICD-10-CM | POA: Insufficient documentation

## 2018-11-22 DIAGNOSIS — T40601A Poisoning by unspecified narcotics, accidental (unintentional), initial encounter: Secondary | ICD-10-CM | POA: Insufficient documentation

## 2018-11-22 DIAGNOSIS — F17210 Nicotine dependence, cigarettes, uncomplicated: Secondary | ICD-10-CM | POA: Diagnosis present

## 2018-11-22 DIAGNOSIS — Z79899 Other long term (current) drug therapy: Secondary | ICD-10-CM | POA: Insufficient documentation

## 2018-11-22 DIAGNOSIS — F17200 Nicotine dependence, unspecified, uncomplicated: Secondary | ICD-10-CM | POA: Insufficient documentation

## 2018-11-22 LAB — COMPREHENSIVE METABOLIC PANEL
ALT: 16 U/L (ref 0–44)
AST: 35 U/L (ref 15–41)
Albumin: 4.4 g/dL (ref 3.5–5.0)
Alkaline Phosphatase: 76 U/L (ref 38–126)
Anion gap: 13 (ref 5–15)
BUN: 16 mg/dL (ref 6–20)
CO2: 28 mmol/L (ref 22–32)
Calcium: 9 mg/dL (ref 8.9–10.3)
Chloride: 98 mmol/L (ref 98–111)
Creatinine, Ser: 1.29 mg/dL — ABNORMAL HIGH (ref 0.61–1.24)
GFR calc Af Amer: >60 mL/min (ref >60)
GFR calc non Af Amer: >60 mL/min (ref >60)
Glucose, Bld: 118 mg/dL — ABNORMAL HIGH (ref 70–99)
Potassium: 3.1 mmol/L — ABNORMAL LOW (ref 3.5–5.1)
Sodium: 139 mmol/L (ref 135–145)
Total Bilirubin: 0.6 mg/dL (ref 0.3–1.2)
Total Protein: 6.9 g/dL (ref 6.5–8.1)

## 2018-11-22 LAB — CBC
HCT: 40.1 % (ref 39.0–52.0)
Hemoglobin: 13.4 g/dL (ref 13.0–17.0)
MCH: 30.6 pg (ref 26.0–34.0)
MCHC: 33.4 g/dL (ref 30.0–36.0)
MCV: 91.6 fL (ref 80.0–100.0)
Platelets: 181 10*3/uL (ref 150–400)
RBC: 4.38 MIL/uL (ref 4.22–5.81)
RDW: 12.1 % (ref 11.5–15.5)
WBC: 8.1 10*3/uL (ref 4.0–10.5)
nRBC: 0 % (ref 0.0–0.2)

## 2018-11-22 LAB — ETHANOL: Alcohol, Ethyl (B): <10 mg/dL (ref <10)

## 2018-11-22 MED ORDER — LORAZEPAM 1 MG PO TABS: 1.0000 mg | ORAL_TABLET | ORAL | Status: DC | PRN

## 2018-11-22 MED ORDER — ALUM & MAG HYDROXIDE-SIMETH 200-200-20 MG/5ML PO SUSP: 30.0000 mL | ORAL | Status: DC | PRN

## 2018-11-22 MED ORDER — HYDROXYZINE HCL 25 MG PO TABS
25.0000 mg | ORAL_TABLET | Freq: Three times a day (TID) | ORAL | Status: DC | PRN
  Administered 2018-11-23: 22:00:00 25 mg via ORAL
  Filled 2018-11-22: qty 1

## 2018-11-22 MED ORDER — ZIPRASIDONE MESYLATE 20 MG IM SOLR: 20.0000 mg | INTRAMUSCULAR | Status: DC | PRN

## 2018-11-22 MED ORDER — NICOTINE 21 MG/24HR TD PT24
21.0000 mg | MEDICATED_PATCH | Freq: Every day | TRANSDERMAL | Status: DC
  Administered 2018-11-22: 15:00:00 21 mg via TRANSDERMAL
  Filled 2018-11-22: qty 1

## 2018-11-22 MED ORDER — POTASSIUM CHLORIDE CRYS ER 20 MEQ PO TBCR
40.0000 meq | EXTENDED_RELEASE_TABLET | Freq: Once | ORAL | Status: AC
  Administered 2018-11-22: 40 meq via ORAL
  Filled 2018-11-22: qty 2

## 2018-11-22 MED ORDER — MAGNESIUM HYDROXIDE 400 MG/5ML PO SUSP: 30.0000 mL | Freq: Every day | ORAL | Status: DC | PRN

## 2018-11-22 MED ORDER — ACETAMINOPHEN 325 MG PO TABS: 650.0000 mg | ORAL_TABLET | Freq: Four times a day (QID) | ORAL | Status: DC | PRN

## 2018-11-22 MED ORDER — RISPERIDONE 1 MG PO TBDP
2.0000 mg | ORAL_TABLET | Freq: Three times a day (TID) | ORAL | Status: DC | PRN
  Filled 2018-11-22: qty 2

## 2018-11-22 MED ORDER — NICOTINE 21 MG/24HR TD PT24
21.0000 mg | MEDICATED_PATCH | Freq: Every day | TRANSDERMAL | Status: DC
  Administered 2018-11-23: 10:00:00 21 mg via TRANSDERMAL
  Filled 2018-11-22 (×2): qty 1

## 2018-11-22 NOTE — BH Assessment (Signed)
Patient is to be admitted to [facility] by Dr. Viviano Simas.  Attending Physician will be Dr. Toni Amend.   Patient has been assigned to room 319, by [facility] Charge Nurse [place].

## 2018-11-22 NOTE — Progress Notes (Signed)
Patient pleasant and cooperative during admission assessment. Patient denies SI/HI at this time. Patient denies AVH. Patient informed of fall risk status, fall risk assessed "low" at this time. Patient oriented to unit/staff/room. Patient denies any questions/concerns at this time. Patient safe on unit with Q15 minute checks for safety. Skin assessment and body searching done,no contraband found.

## 2018-11-22 NOTE — ED Notes (Signed)
Report to include Situation, Background, Assessment, and Recommendations received from Gary RN. Patient alert and oriented, warm and dry, in no acute distress. Patient denies SI, HI, AVH and pain. Patient made aware of Q15 minute rounds and security cameras for their safety. Patient instructed to come to me with needs or concerns.  

## 2018-11-22 NOTE — BH Assessment (Signed)
Assessment Note  James Holloway is an 23 y.o. male who presents to the ER due to overdose on heroin. This is the patient's third visit to the ER this month due to overdoing on drugs. While in the ER, it was discovered patient has a history of untreated depression, as well as high risk behaviors. Patient admits to abusing drugs to "escape" and not deal with life. He has two children and has limited contact with them. He currently lives with his mother and at risk of homelessness due to his use. It was reported the mother has to administer Narcan, while he was home. She found him in the bathroom unconscious.  During the interview, the patient was calm, cooperative and pleasant. He was able to provide appropriate answers to the questions. He has an upcoming court date due to breaking and entering.  Diagnosis: Major Depression  Past Medical History:  Past Medical History:  Diagnosis Date  . Opioid dependence (HCC)    heroin abuse    History reviewed. No pertinent surgical history.  Family History: History reviewed. No pertinent family history.  Social History:  reports that he has been smoking. He has never used smokeless tobacco. He reports current drug use. Drug: IV. He reports that he does not drink alcohol.  Additional Social History:  Alcohol / Drug Use Pain Medications: See MAR Prescriptions: See MAR Over the Counter: See MAR History of alcohol / drug use?: Yes Longest period of sobriety (when/how long): Six months Negative Consequences of Use: Legal, Personal relationships, Work / Programmer, multimedia, Surveyor, quantity Substance #1 Name of Substance 1:  Heroin 1 - Age of First Use: 22 1 - Amount (size/oz): 1gram 1 - Frequency: one to two times a week 1 - Duration: Approximately a year 1 - Last Use / Amount: 11/21/2018  CIWA: CIWA-Ar BP: 127/72 Pulse Rate: 100 COWS:    Allergies: No Known Allergies  Home Medications: (Not in a hospital admission)   OB/GYN Status:  No LMP for male  patient.  General Assessment Data Location of Assessment: [facility] ED TTS Assessment: In system Is this a Tele or Face-to-Face Assessment?: Face-to-Face Is this an Initial Assessment or a Re-assessment for this encounter?: Initial Assessment Language Other than English: No Living Arrangements: Other (Comment)(Private Home) What gender do you identify as?: Male Marital status: Single Pregnancy Status: No Living Arrangements: Alone Can pt return to current living arrangement?: Yes Admission Status: Involuntary Petitioner: Other Is patient capable of signing voluntary admission?: No(Under IVC) Referral Source: Self/Family/Friend Insurance type: None  Medical Screening Exam [facility] Walk-in ONLY) Medical Exam completed: Yes  Crisis Care Plan Living Arrangements: Alone Legal Guardian: Other:(Self) Name of Psychiatrist: Reports of none Name of Therapist: Reports of none  Education Status Is patient currently in school?: No Is the patient employed, unemployed or receiving disability?: Employed  Risk to self with the past 6 months Suicidal Ideation: No Has patient been a risk to self within the past 6 months prior to admission? : Yes Suicidal Intent: No Has patient had any suicidal intent within the past 6 months prior to admission? : No Is patient at risk for suicide?: No Suicidal Plan?: No Has patient had any suicidal plan within the past 6 months prior to admission? : No Access to Means: No What has been your use of drugs/alcohol within the last 12 months?: Heroin Previous Attempts/Gestures: No How many times?: 0 Other Self Harm Risks: Active Drug use Triggers for Past Attempts: None known Intentional Self Injurious Behavior: None Family  Suicide History: No Recent stressful life event(s): Legal Issues, Loss (Comment), Other (Comment), Conflict (Comment) Persecutory voices/beliefs?: No Depression: No Depression Symptoms: Guilt Substance abuse history and/or treatment for  substance abuse?: Yes Suicide prevention information given to non-admitted patients: Not applicable  Risk to Others within the past 6 months Homicidal Ideation: No Does patient have any lifetime risk of violence toward others beyond the six months prior to admission? : No Thoughts of Harm to Others: No Current Homicidal Intent: No Current Homicidal Plan: No Access to Homicidal Means: No Identified Victim: Reports of none History of harm to others?: No Assessment of Violence: None Noted Violent Behavior Description: Reports of none Does patient have access to weapons?: No Criminal Charges Pending?: No Does patient have a court date: No Is patient on probation?: No  Psychosis Hallucinations: None noted Delusions: None noted  Mental Status Report Appearance/Hygiene: Unremarkable, In scrubs Eye Contact: Good Motor Activity: Freedom of movement, Unremarkable Speech: Logical/coherent, Unremarkable Level of Consciousness: Alert Mood: Pleasant Affect: Appropriate to circumstance Anxiety Level: Minimal Thought Processes: Coherent, Relevant Judgement: Unimpaired Orientation: Person, Place, Time, Situation, Appropriate for developmental age Obsessive Compulsive Thoughts/Behaviors: Minimal  Cognitive Functioning Concentration: Normal Memory: Recent Intact, Remote Intact Is patient IDD: No Insight: Fair Impulse Control: Fair Appetite: Fair Have you had any weight changes? : No Change Sleep: No Change Total Hours of Sleep: 8 Vegetative Symptoms: None  ADLScreening [facility]) Patient's cognitive ability adequate to safely complete daily activities?: Yes Patient able to express need for assistance with ADLs?: Yes Independently performs ADLs?: Yes (appropriate for developmental age)  Prior Inpatient Therapy Prior Inpatient Therapy: Yes Prior Therapy Dates: 04/2018 Prior Therapy Facilty/Provider(s): [facility] Reason for Treatment: Substance Abuse  Prior  Outpatient Therapy Prior Outpatient Therapy: Yes Prior Therapy Dates: 10/2018 Prior Therapy Facilty/Provider(s): [facility] Reason for Treatment: Substance Use Does patient have an ACCT team?: No Does patient have Intensive In-House Services?  : No Does patient have [facility] services? : No Does patient have P4CC services?: No  ADL Screening (condition at time of admission) Patient's cognitive ability adequate to safely complete daily activities?: Yes Is the patient deaf or have difficulty hearing?: No Does the patient have difficulty seeing, even when wearing glasses/contacts?: No Does the patient have difficulty concentrating, remembering, or making decisions?: No Patient able to express need for assistance with ADLs?: Yes Does the patient have difficulty dressing or bathing?: No Independently performs ADLs?: Yes (appropriate for developmental age) Does the patient have difficulty walking or climbing stairs?: No Weakness of Legs: None Weakness of Arms/Hands: None  Home Assistive Devices/Equipment Home Assistive Devices/Equipment: None  Therapy Consults (therapy consults require a physician order) PT Evaluation Needed: No OT Evalulation Needed: No SLP Evaluation Needed: No Abuse/Neglect Assessment (Assessment to be complete while patient is alone) Abuse/Neglect Assessment Can Be Completed: Yes Physical Abuse: Denies Verbal Abuse: Denies Sexual Abuse: Denies Exploitation of patient/patient's resources: Denies Self-Neglect: Denies Values / Beliefs Cultural Requests During Hospitalization: None Spiritual Requests During Hospitalization: None Consults Spiritual Care Consult Needed: No Social Work Consult Needed: No Merchant navy officerAdvance Directives (For Healthcare) Does Patient Have a Medical Advance Directive?: No       Child/Adolescent Assessment Running Away Risk: Denies(Patient is an adult)  Disposition:  Disposition Initial Assessment Completed for this Encounter: Yes  On  Site Evaluation by:   Reviewed with Physician:    Lilyan Gilfordalvin J. Hamna Asa MS, LCAS, LPMHCA, NCC, CCSI Therapeutic Triage Specialist 11/22/2018 12:33 PM

## 2018-11-22 NOTE — ED Provider Notes (Addendum)
[facility] Emergency Department Provider Note _________________________________   First MD Initiated Contact with Patient 11/22/18 [phone]     (approximate)  I have reviewed the triage vital signs and the nursing notes.   HISTORY  Chief Complaint Drug Overdose    HPI James Holloway is a 23 y.o. male with medical history as listed below presents to the emergency department via EMS for accidental heroin overdose.  EMS states that police found the patient behind a restaurant apneic cyanotic and as such 4 mg of intranasal Narcan was administered with return of respiratory status and mental status.  Patient states that he snorted heroin last night.  Patient denies any suicidal ideation.  Of note patient was recently seen in the emergency department on April 5 and April 18 secondary to accidental heroin overdose.       Past Medical History:  Diagnosis Date  . Opioid dependence (HCC)    heroin abuse    There are no active problems to display for this patient.   No past surgical history on file.  Prior to Admission medications   Medication Sig Start Date End Date Taking? Authorizing Provider  Naloxone HCl 0.4 MG/0.4ML SOAJ Follow package instructions for heroin or opioid overdose. 07/12/18   Sharman Cheek, MD  silver sulfADIAZINE (SILVADENE) 1 % cream Apply to affected area daily 03/21/18   Menshew, Charlesetta Ivory, PA-C  traMADol (ULTRAM) 50 MG tablet Take 1 tablet (50 mg total) by mouth 2 (two) times daily. 03/21/18   Menshew, Charlesetta Ivory, PA-C    Allergies Patient has no known allergies.  No family history on file.  Social History Social History   Tobacco Use  . Smoking status: Current Some Day Smoker  . Smokeless tobacco: Never Used  Substance Use Topics  . Alcohol use: Never    Frequency: Never  . Drug use: Yes    Types: IV    Comment: heroin    Review of Systems Constitutional: No fever/chills Eyes: No visual changes. ENT:  No sore throat. Cardiovascular: Denies chest pain. Respiratory: Denies shortness of breath. Gastrointestinal: No abdominal pain.  No nausea, no vomiting.  No diarrhea.  No constipation. Genitourinary: Negative for dysuria. Musculoskeletal: Negative for neck pain.  Negative for back pain. Integumentary: Negative for rash. Neurological: Negative for headaches, focal weakness or numbness. Psychiatric:  Positive for heroin overdose  ____________________________________________   PHYSICAL EXAM:  VITAL SIGNS: ED Triage Vitals  Enc Vitals Group     BP 11/22/18 0115 139/87     Pulse Rate 11/22/18 0115 (!) 101     Resp 11/22/18 0115 12     Temp 11/22/18 0115 (!) 97.5 F (36.4 C)     Temp src --      SpO2 11/22/18 0115 100 %     Weight 11/22/18 0116 74.8 kg (164 lb 14.5 oz)     Height 11/22/18 0116 1.854 m (6\' 1" )     Head Circumference --      Peak Flow --      Pain Score 11/22/18 0116 0     Pain Loc --      Pain Edu? --      Excl. in GC? --     Constitutional: Alert and oriented. Well appearing and in no acute distress. Eyes: Conjunctivae are normal. Head: Atraumatic. Mouth/Throat: Mucous membranes are moist.  Oropharynx non-erythematous. Neck: No stridor.   Cardiovascular: Normal rate, regular rhythm. Good peripheral circulation. Grossly normal heart sounds. Respiratory: Normal  respiratory effort.  No retractions. No audible wheezing. Gastrointestinal: Soft and nontender. No distention.  Musculoskeletal: No lower extremity tenderness nor edema. No gross deformities of extremities. Neurologic:  Normal speech and language. No gross focal neurologic deficits are appreciated.  Skin:  Skin is warm, dry and intact. No rash noted. Psychiatric: Mood and affect are normal. Speech and behavior are normal.  ____________________________________________   LABS (all labs ordered are listed, but only abnormal results are displayed)  Labs Reviewed  COMPREHENSIVE METABOLIC PANEL -  Abnormal; Notable for the following components:      Result Value   Potassium 3.1 (*)    Glucose, Bld 118 (*)    Creatinine, Ser 1.29 (*)    All other components within normal limits  ETHANOL  CBC  URINE DRUG SCREEN, QUALITATIVE ([facility] ONLY)    ____________________________________________  ED ECG REPORT I, Savage N Ashvin Adelson, the attending physician, personally viewed and interpreted this ECG.   Date: 11/22/2018  EKG Time: 1:16 AM  Rate: 98  Rhythm: Normal sinus rhythm  Axis: Normal  Intervals: Normal  ST&T Change: None   Procedures   ____________________________________________   INITIAL IMPRESSION / MDM / ASSESSMENT AND PLAN / ED COURSE  As part of my medical decision making, I reviewed the following data within the electronic MEDICAL RECORD NUMBER   23 year old male presenting to the emergency department following heroin overdose.  This is the patient's third heroin overdose this month.  As such I involuntarily committed the patient as he clearly presents a danger to himself    ____________________________________________  FINAL CLINICAL IMPRESSION(S) / ED DIAGNOSES  Final diagnoses:  Opiate overdose, accidental or unintentional, initial encounter (HCC)     MEDICATIONS GIVEN DURING THIS VISIT:  Medications  potassium chloride SA (K-DUR) CR tablet 40 mEq (40 mEq Oral Given 11/22/18 0226)     ED Discharge Orders    None       Note:  This document was prepared using Dragon voice recognition software and may include unintentional dictation errors.   Darci CurrentBrown, [place] N, MD 11/22/18 [phone]    Darci CurrentBrown, [place] N, MD 11/30/18 [phone]

## 2018-11-22 NOTE — Consult Note (Signed)
[facility] Face-to-Face Psychiatry Consult   Reason for Consult:  Suicide attempt Referring Physician:  Dr. Scotty Court Patient Identification: James Holloway MRN:  [id number] Principal Diagnosis: Suicide attempt by heroin overdose [facility]) Diagnosis:  Principal Problem:   Suicide attempt by heroin overdose (HCC) Active Problems:   Substance induced mood disorder (HCC)   Opioid abuse (HCC)   Total Time spent with patient: 1 hour  Subjective:  "I need to escape reality.  I have been having a lot of relationship issues."  HPI: James Holloway is a 23 y.o. male patient brought to the hospital with his 4th heroin overdose in past 2 weeks. These overdoses have been characterized by apnea and cyanosis requiring life-saving reversal with Narcan before arrival to the hospital.  Each attempt has been apparently more severe, with this last attempt being in a remote location where he may not have been found.  On evaluation, patient minimizing symptoms of substance use and depressed mood.  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  He describes that because of his long work hours he has not been having any difficulty with sleeping.  He describes a poor appetite.  He states at work he is able to stay focused on tasks and has a normal level of energy.  He describes having his usual interests, however with the stress of COVID 19 he has been limited in his ability to do things.  He endorses a sense of guilt regarding his drug use, fear that is causing his mother, and strain on relationship with the mother of his children, and concerns for his young children.  Patient states "I do not really know why I use heroin, other than to get high and escape reality."  He has difficulty expressing his emotions and what he feels he needs to escape from.  He does report periods of depression which he relates to being "normal."  Patient reports  that he has no memory of using and the subsequent events last night other than he was walking.  Patient is unaware how he was found or how he arrived at the hospital.  Today he reports, "I am happy that I am alive."  Patient reports that he just completed rehab program 1 months ago.  Had also reports he has been successful with suboxone treatment in the past, but did not have insurance at that time and could not afford to continue treatment. History of opiate withdrawal when used regularly, last June 2019.  Denies recent withdrawal symptoms.  Patient denies a history of self-harm other than substance use.  He denies mania symptoms, homicidal ideation, auditory or visual hallucinations.  Collateral from patient's mother via Dr. Scotty Court: Received a call from the patient's mother, Antony Blackbird.  She reports being very worried that her son will die from his drug use and is hopeful that he will go into inpatient treatment of some sort. She notes that this morning he was found behind a restaurant in an out of the way place.  She has had to use rescue Narcan on him at home just 4 days ago. Her phone number is [phone].  Past Psychiatric History: polysubstance abuse  Risk to Self: Suicidal Ideation: No Suicidal Intent: No Is patient at risk for suicide?: No Suicidal Plan?: No Access to Means: No What has been your use of drugs/alcohol within the last 12 months?: Heroin How many times?: 0 Other  Self Harm Risks: Active Drug use Triggers for Past Attempts: None known Intentional Self Injurious Behavior: None Risk to Others: Homicidal Ideation: No Thoughts of Harm to Others: No Current Homicidal Intent: No Current Homicidal Plan: No Access to Homicidal Means: No Identified Victim: Reports of none History of harm to others?: No Assessment of Violence: None Noted Violent Behavior Description: Reports of none Does patient have access to weapons?: No Criminal Charges Pending?: No Does patient have a  court date: No Prior Inpatient Therapy: Prior Inpatient Therapy: Yes Prior Therapy Dates: 04/2018 Prior Therapy Facilty/Provider(s): [facility] Reason for Treatment: Substance Abuse Prior Outpatient Therapy: Prior Outpatient Therapy: Yes Prior Therapy Dates: 10/2018 Prior Therapy Facilty/Provider(s): [facility] Reason for Treatment: Substance Use Does patient have an ACCT team?: No Does patient have Intensive In-House Services?  : No Does patient have [facility] services? : No Does patient have P4CC services?: No  Past Medical History:  Past Medical History:  Diagnosis Date  . Opioid dependence (HCC)    heroin abuse   No past surgical history on file. Family History: No family history on file.   Family Psychiatric  History: none indicated  Social History:  Social History   Substance and Sexual Activity  Alcohol Use Never  . Frequency: Never     Social History   Substance and Sexual Activity  Drug Use Yes  . Types: IV   Comment: heroin    Social History   Socioeconomic History  . Marital status: Single    Spouse name: Not on file  . Number of children: Not on file  . Years of education: Not on file  . Highest education level: Not on file  Occupational History  . Not on file  Social Needs  . Financial resource strain: Not on file  . Food insecurity:    Worry: Not on file    Inability: Not on file  . Transportation needs:    Medical: Not on file    Non-medical: Not on file  Tobacco Use  . Smoking status: Current Some Day Smoker  . Smokeless tobacco: Never Used  Substance and Sexual Activity  . Alcohol use: Never    Frequency: Never  . Drug use: Yes    Types: IV    Comment: heroin  . Sexual activity: Not on file  Lifestyle  . Physical activity:    Days per week: Not on file    Minutes per session: Not on file  . Stress: Not on file  Relationships  . Social connections:    Talks on phone: Not on file    Gets together: Not on file    Attends  religious service: Not on file    Active member of club or organization: Not on file    Attends meetings of clubs or organizations: Not on file    Relationship status: Not on file  Other Topics Concern  . Not on file  Social History Narrative  . Not on file   Additional Social History:  Lives with mother Has been working 7 days a week extended shifts. Has difficult relationship with his children's mother with shared custody. Children ages 59 years and 11 months old.  Using heroin 1-2 times a week   Allergies:  No Known Allergies  Labs:  Results for orders placed or performed during the hospital encounter of 11/22/18 (from the past 48 hour(s))  Comprehensive metabolic panel     Status: Abnormal   Collection Time: 11/22/18  1:29 AM  Result  Value Ref Range   Sodium 139 135 - 145 mmol/L   Potassium 3.1 (L) 3.5 - 5.1 mmol/L   Chloride 98 98 - 111 mmol/L   CO2 28 22 - 32 mmol/L   Glucose, Bld 118 (H) 70 - 99 mg/dL   BUN 16 6 - 20 mg/dL   Creatinine, Ser 1.611.29 (H) 0.61 - 1.24 mg/dL   Calcium 9.0 8.9 - 09.610.3 mg/dL   Total Protein 6.9 6.5 - 8.1 g/dL   Albumin 4.4 3.5 - 5.0 g/dL   AST 35 15 - 41 U/L   ALT 16 0 - 44 U/L   Alkaline Phosphatase 76 38 - 126 U/L   Total Bilirubin 0.6 0.3 - 1.2 mg/dL   GFR calc non Af Amer >60 >60 mL/min   GFR calc Af Amer >60 >60 mL/min   Anion gap 13 5 - 15    Comment: Performed at [facility], [address] [place], [place] [address]  Ethanol     Status: None   Collection Time: 11/22/18  1:29 AM  Result Value Ref Range   Alcohol, Ethyl (B) <10 <10 mg/dL    Comment: (NOTE) Lowest detectable limit for serum alcohol is 10 mg/dL. For medical purposes only. Performed at [facility], [address] [place], [place] [address]   cbc     Status: None   Collection Time: 11/22/18  1:29 AM  Result Value Ref Range   WBC 8.1 4.0 - 10.5 K/uL   RBC 4.38 4.22 - 5.81 MIL/uL   Hemoglobin 13.4 13.0 - 17.0 g/dL   HCT 14.740.1 82.939.0 - 56.252.0 %    MCV 91.6 80.0 - 100.0 fL   MCH 30.6 26.0 - 34.0 pg   MCHC 33.4 30.0 - 36.0 g/dL   RDW 13.012.1 86.511.5 - 78.415.5 %   Platelets 181 150 - 400 K/uL   nRBC 0.0 0.0 - 0.2 %    Comment: Performed at [facility], [address] [place], [place] [address]    No current facility-administered medications for this encounter.    Current Outpatient Medications  Medication Sig Dispense Refill  . Buprenorphine HCl-Naloxone HCl 8-2 MG FILM Place 0.5 Film under the tongue daily.    . Naloxone HCl 0.4 MG/0.4ML SOAJ Follow package instructions for heroin or opioid overdose. (Patient not taking: Reported on 11/22/2018) 1 Package 1    Musculoskeletal: Strength & Muscle Tone: within normal limits Gait & Station: normal Patient leans: N/A  Psychiatric Specialty Exam: Physical Exam  ROS  Blood pressure 127/72, pulse 100, temperature (!) 97.5 F (36.4 C), resp. rate (!) 26, height 6\' 1"  (1.854 m), weight 74.8 kg, SpO2 96 %.Body mass index is 21.76 kg/m.  General Appearance: Casual  Eye Contact:  Fair  Speech:  Clear and Coherent  Volume:  Decreased  Mood:  Anxious and Dysphoric  Affect:  Congruent  Thought Process:  Coherent  Orientation:  Full (Time, Place, and Person)  Thought Content:  Logical and Hallucinations: None  Suicidal Thoughts:  denies active intent at this time.  4th overdose in 2 weeks  Homicidal Thoughts:  No  Memory:  Immediate;   Fair Recent;   Poor Remote;   Fair  Judgement:  Impaired  Insight:  Shallow  Psychomotor Activity:  Restlessness  Concentration:  Concentration: Good and Attention Span: Good  Recall:  [employer]  Fund of Knowledge:  Fair  Language:  Good  Akathisia:  No  Handed:  Right  AIMS (if indicated):  Assets:  Research scientist (life sciences) Vocational/Educational  ADL's:  Intact  Cognition:  WNL  Sleep:   "ok"     Treatment Plan Summary: Daily contact with patient to assess and evaluate symptoms and progress in treatment, Medication  management and Plan Continue IVC  Disposition: Recommend psychiatric Inpatient admission when medically cleared. Supportive therapy provided about ongoing stressors.  Mariel Craft, MD 11/22/2018 12:22 PM

## 2018-11-22 NOTE — ED Triage Notes (Signed)
Pt used IV heroin. EMS called for accidental overdose. Pt is responsive to verbal.

## 2018-11-22 NOTE — [facility] Group Notes (Signed)
[facility] Group Notes:  (Nursing/MHT/Case Management/Adjunct)  Date:  11/22/2018  Time:  11:26 PM  Type of Therapy:  Group Therapy  Participation Level:  Did Not Attend  Mayra Neer 11/22/2018, 11:26 PM

## 2018-11-22 NOTE — ED Provider Notes (Signed)
-----------------------------------------   7:47 AM on 11/22/2018 -----------------------------------------  Telemedicine psychiatry consult note reviewed.  They find the patient to be at a low risk for suicide, have healthcare decision-making capacity, and stable for discharge.  However, as noted in the psychiatry note, this is at least the third overdose in just 2 weeks.  There may have been a fourth 1 as well during which the patient was rescued with Narcan that the patient had been given from the emergency department to have at home.  These overdoses have been characterized by apnea and cyanosis requiring life-saving reversal with Narcan before arrival to the hospital.  On interview the patient says many reassuring things, but his behavior is high risk such that I am worried about unintentional death even if he is not trying to harm himself.  I will continue to retain the patient in the emergency department until he can be evaluated in person by our psychiatrist.   Sharman Cheek, MD 11/22/18 [phone]

## 2018-11-22 NOTE — ED Notes (Signed)
EDP informed of SOC result.

## 2018-11-22 NOTE — ED Notes (Signed)
Patient has eaten meals but has not got out of bed to give [place] a urine sample

## 2018-11-22 NOTE — Tx Team (Signed)
Initial Treatment Plan 11/22/2018 3:37 PM James Holloway JHE:[id number]    PATIENT STRESSORS: Substance abuse Traumatic event   PATIENT STRENGTHS: Ability for insight Average or above average intelligence Communication skills Physical Health Supportive family/friends Work skills   PATIENT IDENTIFIED PROBLEMS: Substance Abuse  11/22/2018  Depression 421/2020  Suicidal 11/22/2018                 DISCHARGE CRITERIA:  Ability to meet basic life and health needs Improved stabilization in mood, thinking, and/or behavior Reduction of life-threatening or endangering symptoms to within safe limits  PRELIMINARY DISCHARGE PLAN: Outpatient therapy Placement in alternative living arrangements Return to previous living arrangement  PATIENT/FAMILY INVOLVEMENT: This treatment plan has been presented to and reviewed with the patient, James Holloway, and/or family member,  .  The patient and family have been given the opportunity to ask questions and make suggestions.  Crist Infante, RN 11/22/2018, 3:37 PM

## 2018-11-22 NOTE — Plan of Care (Signed)
  Problem: Education: Goal: Knowledge of [facility] Education information/materials will improve Note:  Instructed on [employer] , unit programing  and hand washing . Patient  voiced understanding of information received

## 2018-11-22 NOTE — ED Notes (Signed)
Patient in talking with the psychiatrist, patient had a visit from 2 sheriffs ([phone]) patient does not want any officers to know he is here, because he feels he is being harassed by these officers because they want to know who his heroin dealer is since client has overdosed 3 times this month

## 2018-11-22 NOTE — ED Notes (Signed)
Patient talking to TTS 

## 2018-11-22 NOTE — ED Notes (Signed)
Hourly rounding reveals patient sleeping in room. No complaints, stable, in no acute distress. Q15 minute rounds and monitoring via Security to continue. 

## 2018-11-22 NOTE — ED Provider Notes (Signed)
-----------------------------------------   8:09 AM on 11/22/2018 -----------------------------------------  Received a call from the patient's mother, Antony Blackbird.  She reports being very worried that her son will die from his drug use and is hopeful that he will go into inpatient treatment of some sort.  She notes that this morning he was found behind a restaurant and out of the way place.  She has had to use rescue Narcan on him at home just 4 days ago.  Her phone number is [phone].   Sharman Cheek, MD 11/22/18 [phone]

## 2018-11-22 NOTE — ED Notes (Signed)
Pt instructed that a urine sample is needed, verbalized understanding. Urinal at bedside.

## 2018-11-23 ENCOUNTER — Other Ambulatory Visit: Payer: Self-pay | Admitting: Psychiatry

## 2018-11-23 DIAGNOSIS — F1110 Opioid abuse, uncomplicated: Secondary | ICD-10-CM

## 2018-11-23 DIAGNOSIS — T40601A Poisoning by unspecified narcotics, accidental (unintentional), initial encounter: Secondary | ICD-10-CM

## 2018-11-23 MED ORDER — TRAZODONE HCL 50 MG PO TABS
50.0000 mg | ORAL_TABLET | Freq: Every day | ORAL | Status: DC
  Administered 2018-11-24: 50 mg via ORAL
  Filled 2018-11-23: qty 1

## 2018-11-23 MED ORDER — BUPRENORPHINE HCL-NALOXONE HCL 8-2 MG SL SUBL
1.0000 | SUBLINGUAL_TABLET | Freq: Every day | SUBLINGUAL | Status: DC
  Administered 2018-11-23 – 2018-11-24 (×2): 1 via SUBLINGUAL
  Filled 2018-11-23 (×3): qty 1

## 2018-11-23 NOTE — Progress Notes (Signed)
Recreation Therapy Notes  Date: 11/23/2018  Time: 9:30 am  Location: Craft Room  Behavioral response: Appropriate  Intervention Topic: Teamwork  Discussion/Intervention:  Group content on today was focused on teamwork. The group identified what teamwork is. Individuals described who is a part of their team. Patients expressed why they thought teamwork is important. The group stated reasons why they thought it was easier to work with a Comptroller team. Individuals discussed some positives and negatives of working with a team. Patients gave examples of past experiences they had while working with a team. The group participated in the intervention "What is That", where patients were given a chance to point out the qualities, they look for in a teammate and were able to work in teams with each other. Clinical Observations/Feedback:  Patient came to group late due to unknown reasons.  Individual was social with peers and staff while participating in the intervention. Webber Michiels LRT/CTRS         Yukie Bergeron 11/23/2018 11:39 AM

## 2018-11-23 NOTE — [facility] Suicide Risk Assessment (Signed)
[facility] Admission Suicide Risk Assessment   Nursing information obtained from:  Patient Demographic factors:  Male, Caucasian Current Mental Status:  NA Loss Factors:  NA Historical Factors:  NA Risk Reduction Factors:  Responsible for children under 24 years of age, Living with another person, especially a relative  Total Time spent with patient: 1 hour Principal Problem: Opioid abuse (HCC) Diagnosis:  Principal Problem:   Opioid abuse (HCC) Active Problems:   Opiate overdose (HCC)  Subjective Data: Patient seen chart reviewed.  Patient admitted to the hospital after an overdose of heroin that was nearly fatal.  Patient is denying any suicidal intent or plan.  Denies that he was trying to harm himself.  States that he has been relapsed onto heroin but is shooting up only about once a week.  He is aware that he has almost fatally overdosed several times.  Admits that his use is out of control.  Denies however any symptoms of depression.  Denies any psychosis.  Denies homicidal ideation.  Continued Clinical Symptoms:  Alcohol Use Disorder Identification Test Final Score (AUDIT): 1 The "Alcohol Use Disorders Identification Test", Guidelines for Use in Primary Care, Second Edition.  World Science writer [facility]). Score between 0-7:  no or low risk or alcohol related problems. Score between 8-15:  moderate risk of alcohol related problems. Score between 16-19:  high risk of alcohol related problems. Score 20 or above:  warrants further diagnostic evaluation for alcohol dependence and treatment.   CLINICAL FACTORS:   Alcohol/Substance Abuse/Dependencies   Musculoskeletal: Strength & Muscle Tone: within normal limits Gait & Station: normal Patient leans: N/A  Psychiatric Specialty Exam: Physical Exam  Nursing note and vitals reviewed. Constitutional: He appears well-developed and well-nourished.  HENT:  Head: Normocephalic and atraumatic.  Eyes: Pupils are equal, round, and  reactive to light. Conjunctivae are normal.  Neck: Normal range of motion.  Cardiovascular: Regular rhythm and normal heart sounds.  Respiratory: Effort normal. No respiratory distress.  GI: Soft.  Musculoskeletal: Normal range of motion.  Neurological: He is alert.  Skin: Skin is warm and dry.  Psychiatric: He has a normal mood and affect. His speech is normal and behavior is normal. Judgment and thought content normal. Cognition and memory are normal.    Review of Systems  Constitutional: Negative.   HENT: Negative.   Eyes: Negative.   Respiratory: Negative.   Cardiovascular: Negative.   Gastrointestinal: Negative.   Musculoskeletal: Negative.   Skin: Negative.   Neurological: Negative.   Psychiatric/Behavioral: Positive for substance abuse. Negative for depression, hallucinations and suicidal ideas. The patient is not nervous/anxious.     Blood pressure 125/81, pulse 63, temperature 98.6 F (37 C), temperature source Oral, resp. rate 18, height 6\' 1"  (1.854 m), weight 73.5 kg, SpO2 100 %.Body mass index is 21.37 kg/m.  General Appearance: Casual  Eye Contact:  Good  Speech:  Clear and Coherent  Volume:  Normal  Mood:  Euthymic  Affect:  Constricted  Thought Process:  Coherent  Orientation:  Full (Time, Place, and Person)  Thought Content:  Logical  Suicidal Thoughts:  No  Homicidal Thoughts:  No  Memory:  Immediate;   Fair Recent;   Fair Remote;   Fair  Judgement:  Fair  Insight:  Fair  Psychomotor Activity:  Decreased  Concentration:  Concentration: Fair  Recall:  [employer] of Knowledge:  Fair  Language:  Fair  Akathisia:  No  Handed:  Right  AIMS (if indicated):     Assets:  Desire for Improvement Housing Physical Health Social Support  ADL's:  Intact  Cognition:  WNL  Sleep:  Number of Hours: 8.5      COGNITIVE FEATURES THAT CONTRIBUTE TO RISK:  Loss of executive function    SUICIDE RISK:   Minimal: No identifiable suicidal ideation.  Patients  presenting with no risk factors but with morbid ruminations; may be classified as minimal risk based on the severity of the depressive symptoms  PLAN OF CARE: Patient has been taking Suboxone from [facility].  Confirmed recent prescription.  We will allow him to have Suboxone today however he cannot be given a new prescription by me at discharge.  Patient is currently denying any suicidal ideation and does not appear to be suffering from major depression or hopelessness.  He is expressing appropriate willingness to be involved in substance abuse treatment.  Patient will be engaged in individual and group therapy and likely will be discharged tomorrow with follow-up in the community.  No need for any specific psychiatric medicine at this point.  I certify that inpatient services furnished can reasonably be expected to improve the patient's condition.   Mordecai RasmussenJohn , MD 11/23/2018, 12:50 PM

## 2018-11-23 NOTE — Tx Team (Signed)
Interdisciplinary Treatment and Diagnostic Plan Update  11/23/2018 Time of Session: 230p James Holloway MRN: [id number]  Principal Diagnosis: Opioid abuse [facility])  Secondary Diagnoses: Principal Problem:   Opioid abuse (HCC) Active Problems:   Opiate overdose (HCC)   Current Medications:  Current Facility-Administered Medications  Medication Dose Route Frequency Provider Last Rate Last Dose  . acetaminophen (TYLENOL) tablet 650 mg  650 mg Oral Q6H PRN Mariel Craft, MD      . alum & mag hydroxide-simeth (MAALOX/MYLANTA) 200-200-20 MG/5ML suspension 30 mL  30 mL Oral Q4H PRN Mariel Craft, MD      . buprenorphine-naloxone (SUBOXONE) 8-2 mg per SL tablet 1 tablet  1 tablet Sublingual Daily Clapacs, Jackquline Denmark, MD   1 tablet at 11/23/18 1316  . hydrOXYzine (ATARAX/VISTARIL) tablet 25 mg  25 mg Oral TID PRN Mariel Craft, MD      . magnesium hydroxide (MILK OF MAGNESIA) suspension 30 mL  30 mL Oral Daily PRN Mariel Craft, MD      . nicotine (NICODERM CQ - dosed in mg/24 hours) patch 21 mg  21 mg Transdermal Daily Mariel Craft, MD   21 mg at 11/23/18 7062   PTA Medications: Medications Prior to Admission  Medication Sig Dispense Refill Last Dose  . Buprenorphine HCl-Naloxone HCl 8-2 MG FILM Place 0.5 Film under the tongue daily.   unknown at unknown  . Naloxone HCl 0.4 MG/0.4ML SOAJ Follow package instructions for heroin or opioid overdose. 1 Package 1 prn at prn    Patient Stressors: Substance abuse Traumatic event  Patient Strengths: Ability for insight Average or above average intelligence Communication skills Physical Health Supportive family/friends Work skills  Treatment Modalities: Medication Management, Group therapy, Case management,  1 to 1 session with clinician, Psychoeducation, Recreational therapy.   Physician Treatment Plan for Primary Diagnosis: Opioid abuse (HCC) Long Term Goal(s): Improvement in symptoms so as ready for  discharge Improvement in symptoms so as ready for discharge   Short Term Goals: Ability to verbalize feelings will improve Ability to disclose and discuss suicidal ideas Ability to identify triggers associated with substance abuse/mental health issues will improve  Medication Management: Evaluate patient's response, side effects, and tolerance of medication regimen.  Therapeutic Interventions: 1 to 1 sessions, Unit Group sessions and Medication administration.  Evaluation of Outcomes: Progressing  Physician Treatment Plan for Secondary Diagnosis: Principal Problem:   Opioid abuse (HCC) Active Problems:   Opiate overdose (HCC)  Long Term Goal(s): Improvement in symptoms so as ready for discharge Improvement in symptoms so as ready for discharge   Short Term Goals: Ability to verbalize feelings will improve Ability to disclose and discuss suicidal ideas Ability to identify triggers associated with substance abuse/mental health issues will improve     Medication Management: Evaluate patient's response, side effects, and tolerance of medication regimen.  Therapeutic Interventions: 1 to 1 sessions, Unit Group sessions and Medication administration.  Evaluation of Outcomes: Progressing   RN Treatment Plan for Primary Diagnosis: Opioid abuse (HCC) Long Term Goal(s): Knowledge of disease and therapeutic regimen to maintain health will improve  Short Term Goals: Ability to participate in decision making will improve, Ability to verbalize feelings will improve, Ability to disclose and discuss suicidal ideas, Ability to identify and develop effective coping behaviors will improve and Compliance with prescribed medications will improve  Medication Management: RN will administer medications as ordered by provider, will assess and evaluate patient's response and provide education to patient for prescribed medication. RN will  report any adverse and/or side effects to prescribing  provider.  Therapeutic Interventions: 1 on 1 counseling sessions, Psychoeducation, Medication administration, Evaluate responses to treatment, Monitor vital signs and CBGs as ordered, Perform/monitor CIWA, COWS, AIMS and Fall Risk screenings as ordered, Perform wound care treatments as ordered.  Evaluation of Outcomes: Progressing   LCSW Treatment Plan for Primary Diagnosis: Opioid abuse (HCC) Long Term Goal(s): Safe transition to appropriate next level of care at discharge, Engage patient in therapeutic group addressing interpersonal concerns.  Short Term Goals: Engage patient in aftercare planning with referrals and resources  Therapeutic Interventions: Assess for all discharge needs, 1 to 1 time with Social worker, Explore available resources and support systems, Assess for adequacy in community support network, Educate family and significant other(s) on suicide prevention, Complete Psychosocial Assessment, Interpersonal group therapy.  Evaluation of Outcomes: Progressing   Progress in Treatment: Attending groups: Yes. Participating in groups: Yes. Taking medication as prescribed: Yes. Toleration medication: Yes. Family/Significant other contact made: No, will contact:  pt declined Patient understands diagnosis: Yes. Discussing patient identified problems/goals with staff: Yes. Medical problems stabilized or resolved: Yes. Denies suicidal/homicidal ideation: Yes. Issues/concerns per patient self-inventory: No. Other: NA  New problem(s) identified: No, Describe:  none reported  New Short Term/Long Term Goal(s): "To get discharged"  Patient Goals:  "To get discharged"  Discharge Plan or Barriers: Pt scheduled to be d/c on 11/23/2018. Pt [facility] on 11/24/2018. Pt is an existing patient.  Reason for Continuation of Hospitalization: Medication stabilization  Estimated Length of Stay: D/C on 11/24/2018  Attendees: Patient: James Holloway  11/23/2018 2:54 PM  Physician: Mordecai RasmussenJohn Clapacs 11/23/2018 2:54 PM  Nursing: Doyce Paraemetria Ravenell 11/23/2018 2:54 PM  RN Care Manager: 11/23/2018 2:54 PM  Social Worker: Lowella Dandyarren Lyncoln Maskell [facility] Michaela Stanfield 11/23/2018 2:54 PM  Recreational Therapist: Garret ReddishShay Outlaw 11/23/2018 2:54 PM  Other:  11/23/2018 2:54 PM  Other:  11/23/2018 2:54 PM  Other: 11/23/2018 2:54 PM    Scribe for Treatment Team: Suzan SlickARREN T Jinelle Butchko, LCSW 11/23/2018 2:54 PM

## 2018-11-23 NOTE — H&P (Signed)
Psychiatric Admission Assessment Adult  Patient Identification: James Holloway MRN:  [id number] Date of Evaluation:  11/23/2018 Chief Complaint:  MDD Principal Diagnosis: Opioid abuse (HCC) Diagnosis:  Principal Problem:   Opioid abuse (HCC) Active Problems:   Opiate overdose (HCC)  History of Present Illness: 23 year old man with a history of opiate abuse came to the emergency room after an overdose of heroin that was nearly fatal.  This is his fourth overdose in a short period of time.  Patient says that he had been maintaining sobriety for a few months but relapsed in February and since then has been shooting up heroin about once or twice a week.  He understands that these overdoses have been nearly fatal but has consistently denied there was any intention to harm himself.  He denies that he is using any other drugs or drinking.  He is still getting a prescription of Suboxone from [place] [facility] but is able to afford to use it only intermittently.  Patient denies any current suicidal or homicidal ideation.  Denies psychotic symptoms. Associated Signs/Symptoms: Depression Symptoms:  fatigue, anxiety, (Hypo) Manic Symptoms:  None Anxiety Symptoms:  None reported Psychotic Symptoms:  None PTSD Symptoms: Negative Total Time spent with patient: 1 hour  Past Psychiatric History: Patient denies previous inpatient hospitalization.  Denies previous psychiatric medicine.  Denies history of suicide attempts or violence.  Only mental health treatment has been for opiate abuse.  For started using narcotics about 4 years ago.  Has been to inpatient treatment at [facility] twice.  Claims to have had several months of sobriety while taking Suboxone.  Recently has not been able to afford to take his Suboxone regularly which she acknowledges is probably contributing to some of his chaotic use of drugs.  Denies ever having actually tried to kill himself in the past.  Is the patient at risk to self? No.   Has the patient been a risk to self in the past 6 months? No.  Has the patient been a risk to self within the distant past? No.  Is the patient a risk to others? No.  Has the patient been a risk to others in the past 6 months? No.  Has the patient been a risk to others within the distant past? No.   Prior Inpatient Therapy:   Prior Outpatient Therapy:    Alcohol Screening: 1. How often do you have a drink containing alcohol?: Monthly or less 2. How many drinks containing alcohol do you have on a typical day when you are drinking?: 1 or 2 3. How often do you have six or more drinks on one occasion?: Never AUDIT-C Score: 1 4. How often during the last year have you found that you were not able to stop drinking once you had started?: Never 5. How often during the last year have you failed to do what was normally expected from you becasue of drinking?: Never 6. How often during the last year have you needed a first drink in the morning to get yourself going after a heavy drinking session?: Never 7. How often during the last year have you had a feeling of guilt of remorse after drinking?: Never 8. How often during the last year have you been unable to remember what happened the night before because you had been drinking?: Never 9. Have you or someone else been injured as a result of your drinking?: No 10. Has a relative or friend or a doctor or another health worker been concerned about  your drinking or suggested you cut down?: No Alcohol Use Disorder Identification Test Final Score (AUDIT): 1 Alcohol Brief Interventions/Follow-up: AUDIT Score <7 follow-up not indicated Substance Abuse History in the last 12 months:  Yes.   Consequences of Substance Abuse: Medical Consequences:  Multiple overdoses that are nearly fatal Previous Psychotropic Medications: Yes  Psychological Evaluations: Yes  Past Medical History:  Past Medical History:  Diagnosis Date  . Opioid dependence (HCC)    heroin  abuse   History reviewed. No pertinent surgical history. Family History: History reviewed. No pertinent family history. Family Psychiatric  History: Denies any Tobacco Screening: Have you used any form of tobacco in the last 30 days? (Cigarettes, Smokeless Tobacco, Cigars, and/or Pipes): Yes Tobacco use, Select all that apply: 5 or more cigarettes per day Are you interested in Tobacco Cessation Medications?: Yes, will notify MD for an order Counseled patient on smoking cessation including recognizing danger situations, developing coping skills and basic information about quitting provided: Yes Social History:  Social History   Substance and Sexual Activity  Alcohol Use Never  . Frequency: Never     Social History   Substance and Sexual Activity  Drug Use Yes  . Types: IV   Comment: heroin    Additional Social History: Marital status: Single Are you sexually active?: Yes What is your sexual orientation?: Heterosexual Has your sexual activity been affected by drugs, alcohol, medication, or emotional stress?: Pt denies. Does patient have children?: Yes How many children?: 2 How is patient's relationship with their children?: Pt reports "it's good, great".                         Allergies:  No Known Allergies Lab Results:  Results for orders placed or performed during the hospital encounter of 11/22/18 (from the past 48 hour(s))  Comprehensive metabolic panel     Status: Abnormal   Collection Time: 11/22/18  1:29 AM  Result Value Ref Range   Sodium 139 135 - 145 mmol/L   Potassium 3.1 (L) 3.5 - 5.1 mmol/L   Chloride 98 98 - 111 mmol/L   CO2 28 22 - 32 mmol/L   Glucose, Bld 118 (H) 70 - 99 mg/dL   BUN 16 6 - 20 mg/dL   Creatinine, Ser 1.611.29 (H) 0.61 - 1.24 mg/dL   Calcium 9.0 8.9 - 09.610.3 mg/dL   Total Protein 6.9 6.5 - 8.1 g/dL   Albumin 4.4 3.5 - 5.0 g/dL   AST 35 15 - 41 U/L   ALT 16 0 - 44 U/L   Alkaline Phosphatase 76 38 - 126 U/L   Total Bilirubin 0.6 0.3 -  1.2 mg/dL   GFR calc non Af Amer >60 >60 mL/min   GFR calc Af Amer >60 >60 mL/min   Anion gap 13 5 - 15    Comment: Performed at [facility], [address] [place], [place] [address]  Ethanol     Status: None   Collection Time: 11/22/18  1:29 AM  Result Value Ref Range   Alcohol, Ethyl (B) <10 <10 mg/dL    Comment: (NOTE) Lowest detectable limit for serum alcohol is 10 mg/dL. For medical purposes only. Performed at [facility], [address] [place], [place] [address]   cbc     Status: None   Collection Time: 11/22/18  1:29 AM  Result Value Ref Range   WBC 8.1 4.0 - 10.5 K/uL   RBC 4.38 4.22 - 5.81  MIL/uL   Hemoglobin 13.4 13.0 - 17.0 g/dL   HCT 82.6 41.5 - 83.0 %   MCV 91.6 80.0 - 100.0 fL   MCH 30.6 26.0 - 34.0 pg   MCHC 33.4 30.0 - 36.0 g/dL   RDW 94.0 76.8 - 08.8 %   Platelets 181 150 - 400 K/uL   nRBC 0.0 0.0 - 0.2 %    Comment: Performed at [facility], [address] [place], [place] [address]    Blood Alcohol level:  Lab Results  Component Value Date   [facility] <10 11/22/2018   ETH <10 07/12/2018    Metabolic Disorder Labs:  No results found for: HGBA1C, MPG No results found for: PROLACTIN No results found for: CHOL, TRIG, HDL, CHOLHDL, VLDL, LDLCALC  Current Medications: Current Facility-Administered Medications  Medication Dose Route Frequency Provider Last Rate Last Dose  . acetaminophen (TYLENOL) tablet 650 mg  650 mg Oral Q6H PRN Mariel Craft, MD      . alum & mag hydroxide-simeth (MAALOX/MYLANTA) 200-200-20 MG/5ML suspension 30 mL  30 mL Oral Q4H PRN Mariel Craft, MD      . buprenorphine-naloxone (SUBOXONE) 8-2 mg per SL tablet 1 tablet  1 tablet Sublingual Daily Clapacs, John T, MD      . hydrOXYzine (ATARAX/VISTARIL) tablet 25 mg  25 mg Oral TID PRN Mariel Craft, MD      . risperiDONE (RISPERDAL M-TABS) disintegrating tablet 2 mg  2 mg Oral Q8H PRN Mariel Craft, MD       And  . LORazepam (ATIVAN)  tablet 1 mg  1 mg Oral PRN Mariel Craft, MD       And  . ziprasidone (GEODON) injection 20 mg  20 mg Intramuscular PRN Mariel Craft, MD      . magnesium hydroxide (MILK OF MAGNESIA) suspension 30 mL  30 mL Oral Daily PRN Mariel Craft, MD      . nicotine (NICODERM CQ - dosed in mg/24 hours) patch 21 mg  21 mg Transdermal Daily Mariel Craft, MD   21 mg at 11/23/18 5945   PTA Medications: Medications Prior to Admission  Medication Sig Dispense Refill Last Dose  . Buprenorphine HCl-Naloxone HCl 8-2 MG FILM Place 0.5 Film under the tongue daily.   unknown at unknown  . Naloxone HCl 0.4 MG/0.4ML SOAJ Follow package instructions for heroin or opioid overdose. 1 Package 1 prn at prn    Musculoskeletal: Strength & Muscle Tone: within normal limits Gait & Station: normal Patient leans: N/A  Psychiatric Specialty Exam: Physical Exam  Nursing note and vitals reviewed. Constitutional: He appears well-developed and well-nourished.  HENT:  Head: Normocephalic and atraumatic.  Eyes: Pupils are equal, round, and reactive to light. Conjunctivae are normal.  Neck: Normal range of motion.  Cardiovascular: Regular rhythm and normal heart sounds.  Respiratory: Effort normal. No respiratory distress.  GI: Soft.  Musculoskeletal: Normal range of motion.  Neurological: He is alert.  Skin: Skin is warm and dry.  Psychiatric: He has a normal mood and affect. His behavior is normal. Judgment and thought content normal.    Review of Systems  Constitutional: Negative.   HENT: Negative.   Eyes: Negative.   Respiratory: Negative.   Cardiovascular: Negative.   Gastrointestinal: Negative.   Musculoskeletal: Negative.   Skin: Negative.   Neurological: Negative.   Psychiatric/Behavioral: Positive for substance abuse. Negative for depression and suicidal ideas.    Blood pressure 125/81, pulse 63, temperature 98.6 F (  37 C), temperature source Oral, resp. rate 18, height  (1.854 m),  weight 73.5 kg, SpO2 100 %.Body mass index is 21.37 kg/m.  General Appearance: Casual  Eye Contact:  Good  Speech:  Slow  Volume:  Decreased  Mood:  Euthymic  Affect:  Congruent  Thought Process:  Goal Directed  Orientation:  Full (Time, Place, and Person)  Thought Content:  Logical  Suicidal Thoughts:  No  Homicidal Thoughts:  No  Memory:  Immediate;   Fair Recent;   Fair Remote;   Fair  Judgement:  Fair  Insight:  Fair  Psychomotor Activity:  Normal  Concentration:  Concentration: Fair  Recall:  [employer] of Knowledge:  Fair  Language:  Fair  Akathisia:  No  Handed:  Right  AIMS (if indicated):     Assets:  Desire for Improvement Housing Physical Health  ADL's:  Intact  Cognition:  WNL  Sleep:  Number of Hours: 8.5    Treatment Plan Summary: Daily contact with patient to assess and evaluate symptoms and progress in treatment, Medication management and Plan Patient is denying suicidal ideation.  No sign of psychosis.  Does not report major depression.  Patient is stating that he would like to get back involved with active substance abuse treatment.  Finances have been a major hurdle as he has no insurance.  We will try to contact [facility] or also refer him to [facility] whichever would work out better.  I am allowing him to have a single dose of Suboxone today.  Patient has been educated about how intermittent use of this drug may actually make his problem worse.  He needs to find a way to stay on it.  I encouraged him to look into getting on his mother's insurance if he cannot afford insurance himself.  Case reviewed with social work.  Likely discharge tomorrow.  No indication for any other psychiatric medicine at this time.  Observation Level/Precautions:  15 minute checks  Laboratory:  UDS  Psychotherapy:    Medications:    Consultations:    Discharge Concerns:    Estimated LOS:  Other:     Physician Treatment Plan for Primary Diagnosis: Opioid abuse (HCC) Long Term  Goal(s): Improvement in symptoms so as ready for discharge  Short Term Goals: Ability to verbalize feelings will improve and Ability to disclose and discuss suicidal ideas  Physician Treatment Plan for Secondary Diagnosis: Principal Problem:   Opioid abuse (HCC) Active Problems:   Opiate overdose (HCC)  Long Term Goal(s): Improvement in symptoms so as ready for discharge  Short Term Goals: Ability to identify triggers associated with substance abuse/mental health issues will improve  I certify that inpatient services furnished can reasonably be expected to improve the patient's condition.    Mordecai Rasmussen, MD 4/22/202012:53 PM

## 2018-11-23 NOTE — Plan of Care (Signed)
D- Patient alert and oriented. Patient presents in a pleasant mood on assessment stating that he "woke up a lot because of strange vivid dreams" last night and had no major complaints to voice to this writer at this time. Patient denies any signs/symptoms of depression and anxiety to this Clinical research associate. Patient also denies SI, HI, AVH, and pain at this time. Patient's goal for today is "hopefully I get to go home today", in which he will talk to the doctor in order to accomplish his goal.  A- Scheduled medications administered to patient, per MD orders. Support and encouragement provided.  Routine safety checks conducted every 15 minutes.  Patient informed to notify staff with problems or concerns.  R- No adverse drug reactions noted. Patient contracts for safety at this time. Patient compliant with medications and treatment plan. Patient receptive, calm, and cooperative. Patient interacts well with others on the unit.  Patient remains safe at this time.   Problem: Education: Goal: Knowledge of [place] [facility] Education information/materials will improve Outcome: Progressing   Problem: Coping: Goal: Ability to verbalize frustrations and anger appropriately will improve Outcome: Progressing Goal: Ability to demonstrate self-control will improve Outcome: Progressing   Problem: Safety: Goal: Periods of time without injury will increase Outcome: Progressing   Problem: Health Behavior/Discharge Planning: Goal: Ability to identify changes in lifestyle to reduce recurrence of condition will improve Outcome: Progressing Goal: Identification of resources available to assist in meeting health care needs will improve Outcome: Progressing   Problem: Self-Concept: Goal: Will verbalize positive feelings about self Outcome: Progressing Goal: Level of anxiety will decrease Outcome: Progressing

## 2018-11-23 NOTE — Plan of Care (Signed)
Patient has been sleeping since the beginning of the shift, patient has expressed lack of sleep prior to being admitted , patient is allowed to sleep through the night  with out any disturbances and only requiring routine visual checks for safety., no distress.   Problem: Safety: Goal: Periods of time without injury will increase Outcome: Progressing   Problem: Coping: Goal: Ability to verbalize frustrations and anger appropriately will improve Outcome: Not Progressing Goal: Ability to demonstrate self-control will improve Outcome: Not Progressing   Problem: Health Behavior/Discharge Planning: Goal: Ability to identify changes in lifestyle to reduce recurrence of condition will improve Outcome: Not Progressing

## 2018-11-23 NOTE — [facility] Group Notes (Signed)
Emotional Regulation 11/23/2018 1PM  Type of Therapy/Topic:  Group Therapy:  Emotion Regulation  Participation Level:  Did Not Attend   Description of Group:   The purpose of this group is to assist patients in learning to regulate negative emotions and experience positive emotions. Patients will be guided to discuss ways in which they have been vulnerable to their negative emotions. These vulnerabilities will be juxtaposed with experiences of positive emotions or situations, and patients will be challenged to use positive emotions to combat negative ones. Special emphasis will be placed on coping with negative emotions in conflict situations, and patients will process healthy conflict resolution skills.  Therapeutic Goals: 1. Patient will identify two positive emotions or experiences to reflect on in order to balance out negative emotions 2. Patient will label two or more emotions that they find the most difficult to experience 3. Patient will demonstrate positive conflict resolution skills through discussion and/or role plays  Summary of Patient Progress:       Therapeutic Modalities:   Cognitive Behavioral Therapy Feelings Identification Dialectical Behavioral Therapy   Suzan Slick, LCSW 11/23/2018 2:05 PM

## 2018-11-23 NOTE — [facility] Suicide Risk Assessment (Signed)
[facility] INPATIENT:  Family/Significant Other Suicide Prevention Education  Suicide Prevention Education:  Patient Refusal for Family/Significant Other Suicide Prevention Education: The patient James Holloway has refused to provide written consent for family/significant other to be provided Family/Significant Other Suicide Prevention Education during admission and/or prior to discharge.  Physician notified.  SPE completed with pt, as pt refused to consent to family contact. SPI pamphlet provided to pt and pt was encouraged to share information with support network, ask questions, and talk about any concerns relating to SPE. Pt denies access to guns/firearms and verbalized understanding of information provided. Mobile Crisis information also provided to pt.   Harden Mo 11/23/2018, 11:44 AM
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
## 2018-11-24 NOTE — Progress Notes (Signed)
D - Patient was pleasant during assessment and medication administration. Patient denies SI/HI/AVH, pain, anxiety and depression with this Clinical research associate. Patient stated, "I am ready to be go home tomorrow."   A - Patient compliant with medication administration per MD orders and procedures on the unit. Patient given education. Patient given support and encouragement to be active in his treatment plan. Patient informed to let staff know if there are any issues or problems on the unit.   R - Patient being monitored Q 15 minutes for safety per unit protocol. Patient remains safe on the unit.

## 2018-11-24 NOTE — Plan of Care (Signed)
Patient stated his anxiety was better and that he is ready to be leaving tomorrow.   Problem: Self-Concept: Goal: Level of anxiety will decrease Outcome: Progressing

## 2018-11-24 NOTE — Progress Notes (Signed)
D: Patient is aware of  Discharge this shift .Patient denies suicidal /homicidal ideations. Patient received all belongings brought in   A: No Storage medications. Writer reviewed Discharge Summary, Suicide Risk Assessment, and Transitional Record.. Aware  Of follow up appointment .  R: Patient left unit with no questions  Or concerns  With mother  

## 2018-11-24 NOTE — BHH Group Notes (Signed)
LCSW Group Therapy Note  11/24/2018 1:49 PM  Type of Therapy/Topic:  Group Therapy:  Balance in Life  Participation Level:  Did Not Attend  Description of Group:    This group will address the concept of balance and how it feels and looks when one is unbalanced. Patients will be encouraged to process areas in their lives that are out of balance and identify reasons for remaining unbalanced. Facilitators will guide patients in utilizing problem-solving interventions to address and correct the stressor making their life unbalanced. Understanding and applying boundaries will be explored and addressed for obtaining and maintaining a balanced life. Patients will be encouraged to explore ways to assertively make their unbalanced needs known to significant others in their lives, using other group members and facilitator for support and feedback.  Therapeutic Goals: 1. Patient will identify two or more emotions or situations they have that consume much of in their lives. 2. Patient will identify signs/triggers that life has become out of balance:  3. Patient will identify two ways to set boundaries in order to achieve balance in their lives:  4. Patient will demonstrate ability to communicate their needs through discussion and/or role plays  Summary of Patient Progress: x     Therapeutic Modalities:   Cognitive Behavioral Therapy Solution-Focused Therapy Assertiveness Training  Iris Pert, MSW, LCSW Clinical Social Work 11/24/2018 1:49 PM

## 2018-11-24 NOTE — Progress Notes (Signed)
Recreation Therapy Notes  Date: 11/24/2018  Time: 9:30 am   Location: Craft room   Behavioral response: N/A   Intervention Topic: Wellness  Discussion/Intervention: Patient did not attend group.   Clinical Observations/Feedback:  Patient did not attend group.   Kassi Esteve LRT/CTRS         James Holloway 11/24/2018 10:32 AM 

## 2018-11-24 NOTE — Discharge Summary (Signed)
Physician Discharge Summary Note  Patient:  James Holloway is an 23 y.o., male MRN:  [id number] DOB:  [birth year] Patient phone:  [phone] (home)  Patient address:   [place] [address],  Total Time spent with patient: 45 minutes  Date of Admission:  11/22/2018 Date of Discharge: November 24, 2018  Reason for Admission: Admitted through the emergency room where he presented with the most recent of several accidental but near fatal overdoses on heroin  Principal Problem: Opioid abuse [facility]) Discharge Diagnoses: Principal Problem:   Opioid abuse ([place]) Active Problems:   Opiate overdose [facility])   Past Psychiatric History: Patient has a history of substance abuse treatment in the community.  No clear history of depression or other mental illness outside of substance abuse.  No known intentional suicide attempts  Past Medical History:  Past Medical History:  Diagnosis Date  . Opioid dependence ([place])    heroin abuse   History reviewed. No pertinent surgical history. Family History: History reviewed. No pertinent family history. Family Psychiatric  History: See previous.  None identified Social History:  Social History   Substance and Sexual Activity  Alcohol Use Never  . Frequency: Never     Social History   Substance and Sexual Activity  Drug Use Yes  . Types: IV   Comment: heroin    Social History   Socioeconomic History  . Marital status: Single    Spouse name: Not on file  . Number of children: Not on file  . Years of education: Not on file  . Highest education level: Not on file  Occupational History  . Not on file  Social Needs  . Financial resource strain: Not on file  . Food insecurity:    Worry: Not on file    Inability: Not on file  . Transportation needs:    Medical: Not on file    Non-medical: Not on file  Tobacco Use  . Smoking status: Current Some Day Smoker  . Smokeless tobacco: Never Used  Substance and Sexual Activity  .  Alcohol use: Never    Frequency: Never  . Drug use: Yes    Types: IV    Comment: heroin  . Sexual activity: Not on file  Lifestyle  . Physical activity:    Days per week: Not on file    Minutes per session: Not on file  . Stress: Not on file  Relationships  . Social connections:    Talks on phone: Not on file    Gets together: Not on file    Attends religious service: Not on file    Active member of club or organization: Not on file    Attends meetings of clubs or organizations: Not on file    Relationship status: Not on file  Other Topics Concern  . Not on file  Social History Narrative  . Not on file    Hospital Course: Patient admitted to the psychiatric ward.  Consistently denied suicidal ideation.  No dangerous or aggressive behavior.  Met with treatment team and representatives from community mental health and showed appropriate insight.  Counseling provided about the availability and importance of substance abuse and about the risk of death from his continued drug abuse.  Patient will be discharged today as he no longer meets commitment criteria and is being referred to appropriate treatment in the community.  See previous notes for details.  Patient very much needs to get involved with 12-step programs and serious outpatient treatment  and should work on finding a way to get insurance coverage if he finds Suboxone helpful.  Physical Findings: AIMS:  , ,  ,  ,    CIWA:    COWS:     Musculoskeletal: Strength & Muscle Tone: within normal limits Gait & Station: normal Patient leans: N/A  Psychiatric Specialty Exam: Physical Exam  Nursing note and vitals reviewed. Constitutional: He appears well-developed and well-nourished.  HENT:  Head: Normocephalic and atraumatic.  Eyes: Pupils are equal, round, and reactive to light. Conjunctivae are normal.  Neck: Normal range of motion.  Cardiovascular: Regular rhythm and normal heart sounds.  Respiratory: Effort normal.  GI:  Soft.  Musculoskeletal: Normal range of motion.  Neurological: He is alert.  Skin: Skin is warm and dry.  Psychiatric: He has a normal mood and affect. His behavior is normal. Judgment and thought content normal.    Review of Systems  Constitutional: Negative.   HENT: Negative.   Eyes: Negative.   Respiratory: Negative.   Cardiovascular: Negative.   Gastrointestinal: Negative.   Musculoskeletal: Negative.   Skin: Negative.   Neurological: Negative.   Psychiatric/Behavioral: Negative.     Blood pressure 117/75, pulse 85, temperature 97.7 F (36.5 C), temperature source Oral, resp. rate 18, height 6' 1"  (1.854 m), weight 73.5 kg, SpO2 100 %.Body mass index is 21.37 kg/m.  General Appearance: Casual  Eye Contact:  Good  Speech:  Normal Rate  Volume:  Normal  Mood:  Euthymic  Affect:  Congruent  Thought Process:  Goal Directed  Orientation:  Full (Time, Place, and Person)  Thought Content:  Logical  Suicidal Thoughts:  No  Homicidal Thoughts:  No  Memory:  Immediate;   Fair Recent;   Fair Remote;   Fair  Judgement:  Fair  Insight:  Fair  Psychomotor Activity:  Normal  Concentration:  Concentration: Fair  Recall:  [employer] of Knowledge:  Fair  Language:  Fair  Akathisia:  No  Handed:  Right  AIMS (if indicated):     Assets:  Desire for Improvement Housing Physical Health Resilience Social Support  ADL's:  Intact  Cognition:  WNL  Sleep:  Number of Hours: 5.5     Have you used any form of tobacco in the last 30 days? (Cigarettes, Smokeless Tobacco, Cigars, and/or Pipes): Yes  Has this patient used any form of tobacco in the last 30 days? (Cigarettes, Smokeless Tobacco, Cigars, and/or Pipes) Yes, Yes, A prescription for an FDA-approved tobacco cessation medication was offered at discharge and the patient refused  Blood Alcohol level:  Lab Results  Component Value Date   ETH <10 11/22/2018   ETH <10 63/14/9702    Metabolic Disorder Labs:  No results found  for: HGBA1C, MPG No results found for: PROLACTIN No results found for: CHOL, TRIG, HDL, CHOLHDL, VLDL, LDLCALC  See Psychiatric Specialty Exam and Suicide Risk Assessment completed by Attending Physician prior to discharge.  Discharge destination:  Home  Is patient on multiple antipsychotic therapies at discharge:  No   Has Patient had three or more failed trials of antipsychotic monotherapy by history:  No  Recommended Plan for Multiple Antipsychotic Therapies: NA  Discharge Instructions    Diet - low sodium heart healthy   Complete by:  As directed    Increase activity slowly   Complete by:  As directed      Allergies as of 11/24/2018   No Known Allergies     Medication List    STOP taking  these medications   Naloxone HCl 0.4 MG/0.4ML Soaj     TAKE these medications     Indication  Buprenorphine HCl-Naloxone HCl 8-2 MG Film Place 0.5 Film under the tongue daily.  Indication:  Opioid Dependence      Follow-up Information    Pc, [employer]. Go on 11/28/2018.   Why:  Please follow up at [facility] on Monday, November 28, 2018 at 10 am. Please bring photo ID, hospital discharge paperwork. Thank you. Contact information: [address] [place] [address] [phone]           Follow-up recommendations:  Activity:  Activity as tolerated Diet:  Regular diet Other:  Follow-up with outpatient care in the community  Comments: Patient to be discharged with referral back to [facility] this upcoming week.  No new prescriptions provided.  Signed: Alethia Berthold, MD 11/24/2018, 8:57 AM

## 2018-11-24 NOTE — BHH Suicide Risk Assessment (Signed)
[facility] Discharge Suicide Risk Assessment   Principal Problem: Opioid abuse ([place]) Discharge Diagnoses: Principal Problem:   Opioid abuse ([place]) Active Problems:   Opiate overdose ([place])   Total Time spent with patient: 45 minutes  Musculoskeletal: Strength & Muscle Tone: within normal limits Gait & Station: normal Patient leans: N/A  Psychiatric Specialty Exam: Review of Systems  Constitutional: Negative.   HENT: Negative.   Eyes: Negative.   Respiratory: Negative.   Cardiovascular: Negative.   Gastrointestinal: Negative.   Musculoskeletal: Negative.   Skin: Negative.   Neurological: Negative.   Psychiatric/Behavioral: Negative.     Blood pressure 117/75, pulse 85, temperature 97.7 F (36.5 C), temperature source Oral, resp. rate 18, height 6' 1"  (1.854 m), weight 73.5 kg, SpO2 100 %.Body mass index is 21.37 kg/m.  General Appearance: Casual  Eye Contact::  Fair  Speech:  Normal Rate409  Volume:  Normal  Mood:  Euthymic  Affect:  Congruent  Thought Process:  Goal Directed  Orientation:  Full (Time, Place, and Person)  Thought Content:  Logical  Suicidal Thoughts:  No  Homicidal Thoughts:  No  Memory:  Immediate;   Fair Recent;   Fair Remote;   Fair  Judgement:  Fair  Insight:  Fair  Psychomotor Activity:  Normal  Concentration:  Fair  Recall:  [employer] of Knowledge:Fair  Language: Fair  Akathisia:  No  Handed:  Right  AIMS (if indicated):     Assets:  Desire for Improvement Housing Physical Health  Sleep:  Number of Hours: 5.5  Cognition: WNL  ADL's:  Intact   Mental Status Per Nursing Assessment::   On Admission:  NA  Demographic Factors:  Male, Adolescent or young adult and Caucasian  Loss Factors: Decline in physical health  Historical Factors: Impulsivity  Risk Reduction Factors:   Sense of responsibility to family, Religious beliefs about death, Employed, Living with another person, especially a relative and Positive social  support  Continued Clinical Symptoms:  Alcohol/Substance Abuse/Dependencies  Cognitive Features That Contribute To Risk:  Loss of executive function    Suicide Risk:  Minimal: No identifiable suicidal ideation.  Patients presenting with no risk factors but with morbid ruminations; may be classified as minimal risk based on the severity of the depressive symptoms  Follow-up Information    Pc, [employer]. Go on 11/28/2018.   Why:  Please follow up at [facility] on Monday, November 28, 2018 at 10 am. Please bring photo ID, hospital discharge paperwork. Thank you. Contact information: [place] [address] [phone]           Plan Of Care/Follow-up recommendations:  Activity:  Activity as tolerated Diet:  Regular diet Other:  Patient has been counseled about the dangers of ongoing substance abuse.  He has received psychoeducation about the availability of substance abuse treatment.  He has met with counselors from [facility] and with the treatment team.  At this point he does not appear to be at elevated risk of intentional self-harm and no longer meets commitment criteria.  Alethia Berthold, MD 11/24/2018, 8:54 AM

## 2018-11-24 NOTE — Progress Notes (Signed)
  [facility] Adult Case Management Discharge Plan :  Will you be returning to the same living situation after discharge:  Yes,  pt lives with parent At discharge, do you have transportation home?: Yes,  pt says his mother will provide transportation Do you have the ability to pay for your medications: No. referred to provider who can assist with medication.   Release of information consent forms completed and in the chart;  Patient's signature needed at discharge.  Patient to Follow up at: Follow-up Information    Pc, [employer]. Go on 11/28/2018.   Why:  Please follow up at [facility] on Monday, November 28, 2018 at 10 am. Please bring photo ID, hospital discharge paperwork. Thank you. Contact information: [address] [place] [address] [phone]           Next level of care provider has access to [facility] Link:no  Safety Planning and Suicide Prevention discussed: Yes,  with pt; pt declined family contact  Have you used any form of tobacco in the last 30 days? (Cigarettes, Smokeless Tobacco, Cigars, and/or Pipes): Yes  Has patient been referred to the Quitline?: N/A patient is not a smoker  Patient has been referred for addiction treatment: N/A  Suzan Slick, LCSW 11/24/2018, 8:55 AM

## 2019-03-27 ENCOUNTER — Other Ambulatory Visit: Payer: Self-pay

## 2019-03-27 ENCOUNTER — Emergency Department
Admission: EM | Admit: 2019-03-27 | Discharge: 2019-03-27 | Disposition: A | Discharge status: Home / Self Care | Payer: HRSA Program | Attending: Emergency Medicine | Admitting: Emergency Medicine

## 2019-03-27 DIAGNOSIS — F17210 Nicotine dependence, cigarettes, uncomplicated: Secondary | ICD-10-CM | POA: Diagnosis not present

## 2019-03-27 DIAGNOSIS — Z0289 Encounter for other administrative examinations: Secondary | ICD-10-CM | POA: Insufficient documentation

## 2019-03-27 DIAGNOSIS — Z20828 Contact with and (suspected) exposure to other viral communicable diseases: Secondary | ICD-10-CM | POA: Insufficient documentation

## 2019-03-27 DIAGNOSIS — Z79899 Other long term (current) drug therapy: Secondary | ICD-10-CM | POA: Diagnosis not present

## 2019-03-27 DIAGNOSIS — Z008 Encounter for other general examination: Secondary | ICD-10-CM

## 2019-03-27 NOTE — ED Triage Notes (Signed)
Pt is here in custody of [place] [employer] for medical clearance before going to jail pt states he has been around someone tested positive for covid. Denies SX

## 2019-03-27 NOTE — ED Provider Notes (Signed)
[facility] EMERGENCY DEPARTMENT Provider Note   CSN: [id number] Arrival date & time: 03/27/19  1635     History   Chief Complaint Chief Complaint  Patient presents with  . Medical Clearance    HPI James Holloway is a 23 y.o. male presents to the emergency department via custody of [place] [employer].  Patient needing COVID clearance before incarceration.  Patient states he is asymptomatic, but has had exposure to someone who tested positive for COVID.  Patient states his friend tested positive for covid earlier this week.  Patient last in contact with COVID positive person earlier today. patient states he is asymptomatic, no headaches fevers chills body aches nausea vomiting diarrhea, cough or rashes     HPI .  Past Medical History:  Diagnosis Date  . Opioid dependence (HCC)    heroin abuse    Patient Active Problem List   Diagnosis Date Noted  . Opiate overdose (HCC) 11/23/2018  . Suicide attempt by heroin overdose (HCC) 11/22/2018  . Substance induced mood disorder (HCC) 11/22/2018  . Opioid abuse (HCC) 11/22/2018  . MDD (major depressive disorder), recurrent severe, without psychosis (HCC) 11/22/2018    History reviewed. No pertinent surgical history.      Home Medications    Prior to Admission medications   Medication Sig Start Date End Date Taking? Authorizing Provider  Buprenorphine HCl-Naloxone HCl 8-2 MG FILM Place 0.5 Film under the tongue daily. 11/13/18   [provider]    Family History No family history on file.  Social History Social History   Tobacco Use  . Smoking status: Current Some Day Smoker  . Smokeless tobacco: Never Used  Substance Use Topics  . Alcohol use: Never    Frequency: Never  . Drug use: Yes    Types: IV    Comment: heroin     Allergies   Patient has no known allergies.   Review of Systems Review of Systems  Constitutional: Negative.  Negative for activity change,  appetite change, chills and fever.  HENT: Negative for congestion, ear pain, mouth sores, nosebleeds, rhinorrhea, sinus pressure, sore throat and trouble swallowing.   Eyes: Negative for discharge.  Respiratory: Negative for cough, chest tightness and shortness of breath.   Cardiovascular: Negative for chest pain and leg swelling.  Gastrointestinal: Negative for abdominal pain, diarrhea, nausea and vomiting.  Musculoskeletal: Negative for arthralgias, back pain and gait problem.  Skin: Negative for rash.  Neurological: Negative for dizziness and headaches.  Hematological: Negative for adenopathy.  Psychiatric/Behavioral: Negative for agitation and behavioral problems.     Physical Exam Updated Vital Signs BP 113/73 (BP Location: Left Arm)   Pulse (!) 102   Temp 98.3 F (36.8 C) (Oral)   Resp 17   Ht 6\' 1"  (1.854 m)   Wt 74.8 kg   SpO2 99%   BMI 21.77 kg/m   Physical Exam Constitutional:      Appearance: He is well-developed.  HENT:     Head: Normocephalic and atraumatic.     Right Ear: Ear canal normal.     Nose: No congestion or rhinorrhea.     Mouth/Throat:     Mouth: Mucous membranes are moist.     Pharynx: No oropharyngeal exudate or posterior oropharyngeal erythema.  Eyes:     Conjunctiva/sclera: Conjunctivae normal.     Pupils: Pupils are equal, round, and reactive to light.  Neck:     Musculoskeletal: Normal range of motion and neck supple.  Cardiovascular:  Rate and Rhythm: Normal rate and regular rhythm.     Heart sounds: Normal heart sounds.  Pulmonary:     Effort: Pulmonary effort is normal. No respiratory distress.     Breath sounds: Normal breath sounds. No wheezing or rales.  Chest:     Chest wall: No tenderness.  Abdominal:     General: Bowel sounds are normal. There is no distension.     Palpations: Abdomen is soft.     Tenderness: There is no abdominal tenderness.  Musculoskeletal: Normal range of motion.        General: No tenderness.   Skin:    General: Skin is warm and dry.  Neurological:     Mental Status: He is alert and oriented to person, place, and time.  Psychiatric:        Behavior: Behavior normal.        Thought Content: Thought content normal.        Judgment: Judgment normal.      ED Treatments / Results  Labs (all labs ordered are listed, but only abnormal results are displayed) Labs Reviewed  SARS CORONAVIRUS 2 (TAT 6-12 HRS)    EKG None  Radiology No results found.  Procedures Procedures (including critical care time)  Medications Ordered in ED Medications - No data to display   Initial Impression / Assessment and Plan / ED Course  I have reviewed the triage vital signs and the nursing notes.  Pertinent labs & imaging results that were available during my care of the patient were reviewed by me and considered in my medical decision making (see chart for details).        23 year old male here for medical clearance for incarceration.  He is asymptomatic.  Vital signs are stable.  Physical exam is normal.  COVID testing ordered and pending.  Final Clinical Impressions(s) / ED Diagnoses   Final diagnoses:  Medical clearance for incarceration    ED Discharge Orders    None       Renata Caprice 03/27/19 1743    Arta Silence, MD 03/27/19 [phone]

## 2019-03-27 NOTE — ED Triage Notes (Signed)
First RN Note: Pt presents to ED in custody of [place] [employer] requesting a Covid test for medical clearance to go to jail.

## 2019-03-28 LAB — SARS CORONAVIRUS 2 (TAT 6-24 HRS): SARS Coronavirus 2: NEGATIVE

## 2019-09-30 ENCOUNTER — Encounter: Payer: Self-pay | Admitting: Emergency Medicine

## 2019-09-30 ENCOUNTER — Emergency Department
Admission: EM | Admit: 2019-09-30 | Discharge: 2019-09-30 | Disposition: A | Discharge status: Home / Self Care | Payer: Self-pay | Attending: Emergency Medicine | Admitting: Emergency Medicine

## 2019-09-30 ENCOUNTER — Other Ambulatory Visit: Payer: Self-pay

## 2019-09-30 DIAGNOSIS — L0201 Cutaneous abscess of face: Secondary | ICD-10-CM | POA: Insufficient documentation

## 2019-09-30 DIAGNOSIS — L03211 Cellulitis of face: Secondary | ICD-10-CM | POA: Insufficient documentation

## 2019-09-30 DIAGNOSIS — F17200 Nicotine dependence, unspecified, uncomplicated: Secondary | ICD-10-CM | POA: Insufficient documentation

## 2019-09-30 MED ORDER — SULFAMETHOXAZOLE-TRIMETHOPRIM 800-160 MG PO TABS
1.0000 | ORAL_TABLET | Freq: Once | ORAL | Status: AC
  Administered 2019-09-30: 1 via ORAL
  Filled 2019-09-30: qty 1

## 2019-09-30 MED ORDER — SULFAMETHOXAZOLE-TRIMETHOPRIM 800-160 MG PO TABS: 1.0000 | ORAL_TABLET | Freq: Two times a day (BID) | ORAL | 0 refills | Status: AC

## 2019-09-30 MED ORDER — LIDOCAINE-EPINEPHRINE 1 %-1:100000 IJ SOLN
10.0000 mL | Freq: Once | INTRAMUSCULAR | Status: DC
  Filled 2019-09-30: qty 1

## 2019-09-30 NOTE — ED Triage Notes (Addendum)
Pt arrived via POV with reports of right eye swelling, states he slept on the couch last night and woke up this morning with swelling and redness around right eye.   Pt tried to pop spot above right eye, but states nothing came out. Pt frequently drowsy in triage. Falling asleep frequently in triage. Mumbling words.  Pt states he has taken Benadryl, ibuprofen.  Pupils pinpoint on arrival.

## 2019-09-30 NOTE — Discharge Instructions (Signed)
Please keep skin clean.  Change dressing daily as needed.  You may clean skin with peroxide and alcohol as needed.  You may apply thin layer of antibiotic ointment daily.  Take antibiotics as prescribed for 10 days.  Return to the ER for any increasing pain swelling warmth or redness.

## 2019-09-30 NOTE — ED Provider Notes (Signed)
[place] EMERGENCY DEPARTMENT Provider Note   CSN: [id number] Arrival date & time: 09/30/19  1612     History Chief Complaint  Patient presents with  . Facial Swelling    James Holloway is a 24 y.o. male presents to the emergency department for evaluation of right facial abscess, pain, swelling and redness.  Just above the medial aspect of the right eyebrow patient noticed a bump yesterday.  Beginning trying to squeeze the bump and this morning woke up with a lot of swelling around the area.  He has some fluctuance.  There is some puffiness around the right upper and lower eyelid but no eye pain or discomfort.  No drainage.  No vision changes.  He denies any fevers.  He has not any medications for pain.  Denies any recent infections.  HPI     Past Medical History:  Diagnosis Date  . Opioid dependence ([place])    heroin abuse    Patient Active Problem List   Diagnosis Date Noted  . Opiate overdose ([place]) 11/23/2018  . Suicide attempt by heroin overdose ([place]) 11/22/2018  . Substance induced mood disorder ([place]) 11/22/2018  . Opioid abuse ([place]) 11/22/2018  . MDD (major depressive disorder), recurrent severe, without psychosis ([place]) 11/22/2018    History reviewed. No pertinent surgical history.     History reviewed. No pertinent family history.  Social History   Tobacco Use  . Smoking status: Current Some Day Smoker  . Smokeless tobacco: Never Used  Substance Use Topics  . Alcohol use: Never  . Drug use: Yes    Types: IV    Comment: heroin    Home Medications Prior to Admission medications   Medication Sig Start Date End Date Taking? Authorizing Provider  Buprenorphine HCl-Naloxone HCl 8-2 MG FILM Place 0.5 Film under the tongue daily. 11/13/18   [provider]  sulfamethoxazole-trimethoprim (BACTRIM DS) 800-160 MG tablet Take 1 tablet by mouth 2 (two) times daily for 10 days. 09/30/19 10/10/19  Duanne Guess, PA-C     Allergies    Patient has no known allergies.  Review of Systems   Review of Systems  Constitutional: Negative for fever.  Eyes: Negative for photophobia, pain, discharge, redness and visual disturbance.  Respiratory: Negative for shortness of breath.   Cardiovascular: Negative for chest pain.  Musculoskeletal: Negative for neck pain.  Skin: Positive for wound. Negative for color change and rash.  Neurological: Negative for dizziness and headaches.    Physical Exam Updated Vital Signs BP 129/87 (BP Location: Left Arm)   Pulse 84   Temp 99 F (37.2 C) (Oral)   Resp 18   Ht 6\' 1"  (1.854 m)   Wt 74.8 kg   SpO2 99%   BMI 21.77 kg/m   Physical Exam Constitutional:      Appearance: He is well-developed.     Comments: Alert and oriented x3.  Answers all questions appropriately.  HENT:     Head: Normocephalic and atraumatic.     Comments: Fluctuant 3 cm diameter abscess above the right medial brow.  No active drainage.  Area is well demarcated and erythematous.  Slight puffiness and erythema to the upper eyelid and lower eyelid but no induration, fluctuance or tenderness to palpation along the upper or lower eyelid.  Area of well-demarcated erythema and fluctuance along that upper eyebrow abscess is tender in the only area of tenderness. Eyes:     Conjunctiva/sclera: Conjunctivae normal.  Cardiovascular:     Rate  and Rhythm: Normal rate.  Pulmonary:     Effort: Pulmonary effort is normal. No respiratory distress.  Musculoskeletal:        General: Normal range of motion.     Cervical back: Normal range of motion.  Skin:    General: Skin is warm.     Findings: No rash.  Neurological:     General: No focal deficit present.     Mental Status: He is alert and oriented to person, place, and time.     Cranial Nerves: No cranial nerve deficit.     Motor: No weakness.  Psychiatric:        Mood and Affect: Mood normal.        Behavior: Behavior normal.        Thought Content:  Thought content normal.        Judgment: Judgment normal.     ED Results / Procedures / Treatments   Labs (all labs ordered are listed, but only abnormal results are displayed) Labs Reviewed - No data to display  EKG None  Radiology No results found.  Procedures .Marland KitchenIncision and Drainage  Date/Time: 09/30/2019 6:11 PM Performed by: Evon Slack, PA-C Authorized by: Evon Slack, PA-C   Consent:    Consent obtained:  Verbal   Consent given by:  Patient Location:    Type:  Abscess   Location:  Head   Head location:  Face (Right eyebrow) Pre-procedure details:    Skin preparation:  Betadine Anesthesia (see MAR for exact dosages):    Anesthesia method:  Local infiltration   Local anesthetic:  Lidocaine 1% WITH epi Procedure type:    Complexity:  Simple Procedure details:    Needle aspiration: no     Incision types:  Stab incision   Incision depth:  Dermal   Scalpel blade:  11   Wound management:  Probed and deloculated   Drainage:  Bloody and purulent   Drainage amount:  Moderate   Wound treatment:  Wound left open   Packing materials:  None Post-procedure details:    Patient tolerance of procedure:  Tolerated well, no immediate complications   (including critical care time)  Medications Ordered in ED Medications  lidocaine-EPINEPHrine (XYLOCAINE W/EPI) 1 %-1:100000 (with pres) injection 10 mL (has no administration in time range)  sulfamethoxazole-trimethoprim (BACTRIM DS) 800-160 MG per tablet 1 tablet (has no administration in time range)    ED Course  I have reviewed the triage vital signs and the nursing notes.  Pertinent labs & imaging results that were available during my care of the patient were reviewed by me and considered in my medical decision making (see chart for details).    MDM Rules/Calculators/A&P                      24 year old male with right facial abscess to the right eyebrow that began yesterday.  Tried popping of this  yesterday but no improvement.  Today increased pain and swelling.  He has a fluctuant abscess above the right eyebrow with some mild surrounding erythema.  Vital signs are stable, no neurological deficits.  Patient agreed and consented to incision and drainage, this was successfully performed and moderate purulent drainage was expressed, near complete decompression of the fluctuant abscess achieved.  Band-Aid applied.  He is started on Bactrim DS for 10 days.  He understands signs and symptoms return to the ED for such as any increasing pain swelling warmth redness or fevers. Final Clinical Impression(s) /  ED Diagnoses Final diagnoses:  Facial abscess  Facial cellulitis    Rx / DC Orders ED Discharge Orders         Ordered    sulfamethoxazole-trimethoprim (BACTRIM DS) 800-160 MG tablet  2 times daily     09/30/19 1813           Ronnette Juniper 09/30/19 1817    Minna Antis, MD 10/01/19 1242

## 2019-12-04 ENCOUNTER — Inpatient Hospital Stay
Admission: EM | Admit: 2019-12-04 | Discharge: 2019-12-07 | DRG: 854 | Disposition: A | Discharge status: Home Health | Payer: Self-pay | Attending: Internal Medicine | Admitting: Internal Medicine

## 2019-12-04 ENCOUNTER — Inpatient Hospital Stay
Admission: EM | Admit: 2019-12-04 | Discharge: 2019-12-04 | DRG: 603 | Discharge status: Against Medical Advice | Payer: Self-pay | Attending: Internal Medicine | Admitting: Internal Medicine

## 2019-12-04 ENCOUNTER — Encounter: Payer: Self-pay | Admitting: Emergency Medicine

## 2019-12-04 ENCOUNTER — Emergency Department: Payer: Self-pay

## 2019-12-04 ENCOUNTER — Other Ambulatory Visit: Payer: Self-pay

## 2019-12-04 DIAGNOSIS — L0291 Cutaneous abscess, unspecified: Secondary | ICD-10-CM

## 2019-12-04 DIAGNOSIS — F1110 Opioid abuse, uncomplicated: Secondary | ICD-10-CM | POA: Diagnosis present

## 2019-12-04 DIAGNOSIS — F17200 Nicotine dependence, unspecified, uncomplicated: Secondary | ICD-10-CM | POA: Diagnosis present

## 2019-12-04 DIAGNOSIS — L02413 Cutaneous abscess of right upper limb: Principal | ICD-10-CM | POA: Diagnosis present

## 2019-12-04 DIAGNOSIS — F1120 Opioid dependence, uncomplicated: Secondary | ICD-10-CM | POA: Diagnosis present

## 2019-12-04 DIAGNOSIS — I82601 Acute embolism and thrombosis of unspecified veins of right upper extremity: Secondary | ICD-10-CM

## 2019-12-04 DIAGNOSIS — L03113 Cellulitis of right upper limb: Secondary | ICD-10-CM | POA: Diagnosis present

## 2019-12-04 DIAGNOSIS — M7989 Other specified soft tissue disorders: Secondary | ICD-10-CM

## 2019-12-04 DIAGNOSIS — L539 Erythematous condition, unspecified: Secondary | ICD-10-CM

## 2019-12-04 DIAGNOSIS — A4102 Sepsis due to Methicillin resistant Staphylococcus aureus: Principal | ICD-10-CM | POA: Diagnosis present

## 2019-12-04 DIAGNOSIS — F332 Major depressive disorder, recurrent severe without psychotic features: Secondary | ICD-10-CM | POA: Diagnosis present

## 2019-12-04 DIAGNOSIS — Z20822 Contact with and (suspected) exposure to covid-19: Secondary | ICD-10-CM | POA: Diagnosis present

## 2019-12-04 DIAGNOSIS — A419 Sepsis, unspecified organism: Secondary | ICD-10-CM | POA: Diagnosis present

## 2019-12-04 HISTORY — DX: Depression, unspecified: F32.A

## 2019-12-04 LAB — CBC WITH DIFFERENTIAL/PLATELET
Abs Immature Granulocytes: 0.07 10*3/uL (ref 0.00–0.07)
Basophils Absolute: 0.1 10*3/uL (ref 0.0–0.1)
Basophils Relative: 0 %
Eosinophils Absolute: 0.2 10*3/uL (ref 0.0–0.5)
Eosinophils Relative: 1 %
HCT: 34.8 % — ABNORMAL LOW (ref 39.0–52.0)
Hemoglobin: 12.1 g/dL — ABNORMAL LOW (ref 13.0–17.0)
Immature Granulocytes: 0 %
Lymphocytes Relative: 12 %
Lymphs Abs: 1.9 10*3/uL (ref 0.7–4.0)
MCH: 30.3 pg (ref 26.0–34.0)
MCHC: 34.8 g/dL (ref 30.0–36.0)
MCV: 87.2 fL (ref 80.0–100.0)
Monocytes Absolute: 1.2 10*3/uL — ABNORMAL HIGH (ref 0.1–1.0)
Monocytes Relative: 7 %
Neutro Abs: 12.9 10*3/uL — ABNORMAL HIGH (ref 1.7–7.7)
Neutrophils Relative %: 80 %
Platelets: 270 10*3/uL (ref 150–400)
RBC: 3.99 MIL/uL — ABNORMAL LOW (ref 4.22–5.81)
RDW: 11.8 % (ref 11.5–15.5)
WBC: 16.3 10*3/uL — ABNORMAL HIGH (ref 4.0–10.5)
nRBC: 0 % (ref 0.0–0.2)

## 2019-12-04 LAB — URINE DRUG SCREEN, QUALITATIVE (ARMC ONLY)
Amphetamines, Ur Screen: NOT DETECTED
Barbiturates, Ur Screen: NOT DETECTED
Benzodiazepine, Ur Scrn: NOT DETECTED
Cannabinoid 50 Ng, Ur ~~LOC~~: NOT DETECTED
Cocaine Metabolite,Ur ~~LOC~~: NOT DETECTED
MDMA (Ecstasy)Ur Screen: NOT DETECTED
Methadone Scn, Ur: NOT DETECTED
Opiate, Ur Screen: POSITIVE — AB
Phencyclidine (PCP) Ur S: NOT DETECTED
Tricyclic, Ur Screen: NOT DETECTED

## 2019-12-04 LAB — BASIC METABOLIC PANEL
Anion gap: 8 (ref 5–15)
BUN: 9 mg/dL (ref 6–20)
CO2: 27 mmol/L (ref 22–32)
Calcium: 8.5 mg/dL — ABNORMAL LOW (ref 8.9–10.3)
Chloride: 99 mmol/L (ref 98–111)
Creatinine, Ser: 0.85 mg/dL (ref 0.61–1.24)
GFR calc Af Amer: >60 mL/min (ref >60)
GFR calc non Af Amer: >60 mL/min (ref >60)
Glucose, Bld: 108 mg/dL — ABNORMAL HIGH (ref 70–99)
Potassium: 3.8 mmol/L (ref 3.5–5.1)
Sodium: 134 mmol/L — ABNORMAL LOW (ref 135–145)

## 2019-12-04 LAB — RESPIRATORY PANEL BY RT PCR (FLU A&B, COVID)
Influenza A by PCR: NEGATIVE
Influenza B by PCR: NEGATIVE
SARS Coronavirus 2 by RT PCR: NEGATIVE

## 2019-12-04 LAB — LACTIC ACID, PLASMA: Lactic Acid, Venous: 1.2 mmol/L (ref 0.5–1.9)

## 2019-12-04 MED ORDER — ONDANSETRON HCL 4 MG PO TABS: 4.0000 mg | ORAL_TABLET | Freq: Four times a day (QID) | ORAL | Status: DC | PRN

## 2019-12-04 MED ORDER — TETANUS-DIPHTH-ACELL PERTUSSIS 5-2.5-18.5 LF-MCG/0.5 IM SUSP
0.5000 mL | Freq: Once | INTRAMUSCULAR | Status: AC
  Administered 2019-12-04: 0.5 mL via INTRAMUSCULAR
  Filled 2019-12-04: qty 0.5

## 2019-12-04 MED ORDER — VANCOMYCIN HCL IN DEXTROSE 1-5 GM/200ML-% IV SOLN
1000.0000 mg | Freq: Once | INTRAVENOUS | Status: AC
  Administered 2019-12-04: 11:00:00 1000 mg via INTRAVENOUS
  Filled 2019-12-04: qty 200

## 2019-12-04 MED ORDER — HYDROCODONE-ACETAMINOPHEN 5-325 MG PO TABS: 1.0000 | ORAL_TABLET | ORAL | Status: DC | PRN

## 2019-12-04 MED ORDER — ACETAMINOPHEN 325 MG PO TABS
650.0000 mg | ORAL_TABLET | Freq: Four times a day (QID) | ORAL | Status: DC | PRN
  Administered 2019-12-04: 18:00:00 325 mg via ORAL
  Filled 2019-12-04 (×2): qty 2

## 2019-12-04 MED ORDER — VANCOMYCIN HCL IN DEXTROSE 1-5 GM/200ML-% IV SOLN
1000.0000 mg | Freq: Three times a day (TID) | INTRAVENOUS | Status: DC
  Administered 2019-12-05 – 2019-12-07 (×7): 1000 mg via INTRAVENOUS
  Filled 2019-12-04 (×10): qty 200

## 2019-12-04 MED ORDER — SODIUM CHLORIDE 0.9 % IV BOLUS (SEPSIS)
1000.0000 mL | Freq: Once | INTRAVENOUS | Status: AC
  Administered 2019-12-04: 1000 mL via INTRAVENOUS

## 2019-12-04 MED ORDER — HYDROCODONE-ACETAMINOPHEN 5-325 MG PO TABS
1.0000 | ORAL_TABLET | ORAL | Status: DC | PRN
Start: 2019-12-04 — End: 2019-12-04

## 2019-12-04 MED ORDER — PIPERACILLIN-TAZOBACTAM 3.375 G IVPB 30 MIN: 3.3750 g | Freq: Once | INTRAVENOUS | Status: DC

## 2019-12-04 MED ORDER — SENNOSIDES-DOCUSATE SODIUM 8.6-50 MG PO TABS: 1.0000 | ORAL_TABLET | Freq: Every evening | ORAL | Status: DC | PRN

## 2019-12-04 MED ORDER — VANCOMYCIN HCL 500 MG/100ML IV SOLN
500.0000 mg | Freq: Once | INTRAVENOUS | Status: AC
  Administered 2019-12-04: 16:00:00 500 mg via INTRAVENOUS
  Filled 2019-12-04: qty 100

## 2019-12-04 MED ORDER — SODIUM CHLORIDE 0.9 % IV BOLUS (SEPSIS): 250.0000 mL | Freq: Once | INTRAVENOUS | Status: DC

## 2019-12-04 MED ORDER — SODIUM CHLORIDE 0.9 % IV SOLN
2.0000 g | Freq: Once | INTRAVENOUS | Status: AC
  Administered 2019-12-04: 2 g via INTRAVENOUS
  Filled 2019-12-04: qty 20

## 2019-12-04 MED ORDER — BUPRENORPHINE HCL-NALOXONE HCL 8-2 MG SL SUBL: 1.0000 | SUBLINGUAL_TABLET | Freq: Every day | SUBLINGUAL | Status: DC

## 2019-12-04 MED ORDER — BUPRENORPHINE HCL-NALOXONE HCL 8-2 MG SL SUBL
1.0000 | SUBLINGUAL_TABLET | Freq: Every day | SUBLINGUAL | Status: DC
  Administered 2019-12-05 – 2019-12-07 (×3): 1 via SUBLINGUAL
  Filled 2019-12-04 (×3): qty 1

## 2019-12-04 MED ORDER — VANCOMYCIN HCL IN DEXTROSE 1-5 GM/200ML-% IV SOLN: 1000.0000 mg | Freq: Once | INTRAVENOUS | Status: DC

## 2019-12-04 MED ORDER — ONDANSETRON HCL 4 MG/2ML IJ SOLN
4.0000 mg | Freq: Four times a day (QID) | INTRAMUSCULAR | Status: DC | PRN
  Administered 2019-12-05 – 2019-12-06 (×2): 4 mg via INTRAVENOUS
  Filled 2019-12-04: qty 2

## 2019-12-04 MED ORDER — ONDANSETRON HCL 4 MG/2ML IJ SOLN: 4.0000 mg | Freq: Four times a day (QID) | INTRAMUSCULAR | Status: DC | PRN

## 2019-12-04 MED ORDER — ACETAMINOPHEN 650 MG RE SUPP: 650.0000 mg | Freq: Four times a day (QID) | RECTAL | Status: DC | PRN

## 2019-12-04 MED ORDER — SODIUM CHLORIDE 0.9 % IV SOLN: INTRAVENOUS | Status: DC

## 2019-12-04 MED ORDER — HYDROCODONE-ACETAMINOPHEN 5-325 MG PO TABS
1.0000 | ORAL_TABLET | ORAL | Status: DC | PRN
  Administered 2019-12-04 – 2019-12-06 (×4): 1 via ORAL
  Filled 2019-12-04 (×5): qty 1

## 2019-12-04 NOTE — ED Notes (Signed)
Pt expressing the need to leave to pick up his son from day care then he will return to the hospital, explained that he will have to leave AMA. Ron PA to bedside to discuss.   Pt agreed to receiving dose of IV Vanc before leaving

## 2019-12-04 NOTE — ED Notes (Signed)
Pt was called from lobby to have VS. No response. James Holloway from registration stated "pt has been in the bathroom over twenty minutes." This tech went in bathroom and pt was still using bathroom.

## 2019-12-04 NOTE — ED Notes (Signed)
Pt stated to this tech "after I left here I went to [employer] and ate me a cheeseburger because I was hungry and did not eat breakfast." Pt aware that surgeon wanted to see with him to discuss surgery earlier today before he left AMA.

## 2019-12-04 NOTE — ED Notes (Signed)
Pt noted to have taken heart monitor off, this RN bedside. Pt up to bathroom at this time.

## 2019-12-04 NOTE — H&P (Signed)
History and Physical    James Holloway ATF:[id number] DOB: 1996/03/16 DOA: 12/04/2019  PCP: Patient, No Pcp Per   Patient coming from: Home  I have personally briefly reviewed patient's old medical records in [facility]  Chief Complaint: Right forearm swelling  HPI: James Holloway is a 24 y.o. male with medical history significant for IV drug use who presents to the emergency room for evaluation of pain, redness and swelling involving the right forearm.  Patient admits to injecting methamphetamine into his right forearm about 3 days ago. Right forearm ultrasound shows poorly defined abscess in the subcutaneous soft tissues of the right forearm surrounding a thrombosed vein. He denies having any chest pain, shortness of breath, fever, abdominal pain, nausea, vomiting or any changes in his bowel habits.  ED Course: Patient is a 24 year old Caucasian male with a history of IV drug use who is seen in the emergency room for cellulitis/abscess of the right forearm.  He will be admitted to the hospital for IV antibiotics and surgical consult.  Review of Systems: As per HPI otherwise 10 point review of systems negative.    Past Medical History:  Diagnosis Date  . Opioid dependence (HCC)    heroin abuse    History reviewed. No pertinent surgical history.   reports that he has been smoking. He has never used smokeless tobacco. He reports current drug use. Drugs: IV and Methamphetamines. He reports that he does not drink alcohol.  No Known Allergies  No family history on file.   Prior to Admission medications   Medication Sig Start Date End Date Taking? Authorizing Provider  Buprenorphine HCl-Naloxone HCl 8-2 MG FILM Place 1 Film under the tongue daily.    Yes [provider]    Physical Exam: Vitals:   12/04/19 1440 12/04/19 1442 12/04/19 1443 12/04/19 1530  BP: 123/76   116/72  Pulse:  90 92 76  Resp:    15  Temp:      TempSrc:      SpO2:  95% 95%  100%     Vitals:   12/04/19 1440 12/04/19 1442 12/04/19 1443 12/04/19 1530  BP: 123/76   116/72  Pulse:  90 92 76  Resp:    15  Temp:      TempSrc:      SpO2:  95% 95% 100%    Constitutional: NAD, alert and oriented x 3 Eyes: PERRL, lids and conjunctivae normal ENMT: Mucous membranes are moist.  Neck: normal, supple, no masses, no thyromegaly Respiratory: clear to auscultation bilaterally, no wheezing, no crackles. Normal respiratory effort. No accessory muscle use.  Cardiovascular: Regular rate and rhythm, no murmurs / rubs / gallops. No extremity edema. 2+ pedal pulses. No carotid bruits.  Abdomen: no tenderness, no masses palpated. No hepatosplenomegaly. Bowel sounds positive.  Musculoskeletal: no clubbing / cyanosis. No joint deformity upper and lower extremities.  Skin: no rashes, lesions, raised indurated area over right forearm with surrounding redness Neurologic: No gross focal neurologic deficit. Psychiatric: Normal mood and affect.   Labs on Admission: I have personally reviewed following labs and imaging studies  CBC: Recent Labs  Lab 12/04/19 0851  WBC 16.3*  NEUTROABS 12.9*  HGB 12.1*  HCT 34.8*  MCV 87.2  PLT 270   Basic Metabolic Panel: Recent Labs  Lab 12/04/19 0851  NA 134*  K 3.8  CL 99  CO2 27  GLUCOSE 108*  BUN 9  CREATININE 0.85  CALCIUM 8.5*   GFR: Estimated Creatinine  Clearance: 140.6 mL/min (by C-G formula based on SCr of 0.85 mg/dL). Liver Function Tests: No results for input(s): AST, ALT, ALKPHOS, BILITOT, PROT, ALBUMIN in the last 168 hours. No results for input(s): LIPASE, AMYLASE in the last 168 hours. No results for input(s): AMMONIA in the last 168 hours. Coagulation Profile: No results for input(s): INR, PROTIME in the last 168 hours. Cardiac Enzymes: No results for input(s): CKTOTAL, CKMB, CKMBINDEX, TROPONINI in the last 168 hours. BNP (last 3 results) No results for input(s): PROBNP in the last 8760 hours. HbA1C: No  results for input(s): HGBA1C in the last 72 hours. CBG: No results for input(s): GLUCAP in the last 168 hours. Lipid Profile: No results for input(s): CHOL, HDL, LDLCALC, TRIG, CHOLHDL, LDLDIRECT in the last 72 hours. Thyroid Function Tests: No results for input(s): TSH, T4TOTAL, FREET4, T3FREE, THYROIDAB in the last 72 hours. Anemia Panel: No results for input(s): VITAMINB12, FOLATE, FERRITIN, TIBC, IRON, RETICCTPCT in the last 72 hours. Urine analysis: No results found for: COLORURINE, APPEARANCEUR, LABSPEC, [place], GLUCOSEU, HGBUR, BILIRUBINUR, KETONESUR, PROTEINUR, UROBILINOGEN, NITRITE, LEUKOCYTESUR  Radiological Exams on Admission: [place] RT [place] SOFT TISSUE NON VASCULAR  Result Date: 12/04/2019 CLINICAL DATA:  Right forearm swelling and erythema. EXAM: ULTRASOUND RIGHT  UPPER EXTREMITY LIMITED TECHNIQUE: Ultrasound examination of the upper extremity soft tissues was performed in the area of clinical concern. COMPARISON:  None. FINDINGS: There is a poorly defined 5.1 x 1.3 x 2.8 cm complex fluid collection in the subcutaneous fat of the forearm with a connecting 2.5 x 1.0 x 2.3 cm complex superficial fluid collection. There appears to be a thrombosed vein running through these areas. IMPRESSION: Poorly defined abscess in the subcutaneous soft tissues of the right forearm surrounding a thrombosed vein. Electronically Signed   By: Lorriane Shire M.D.   On: 12/04/2019 09:20    EKG: Independently reviewed.   Assessment/Plan Principal Problem:   Sepsis ([place]) Active Problems:   Opioid abuse ([place])   MDD (major depressive disorder), recurrent severe, without psychosis ([place])   Abscess of right upper extremity    Sepsis from right forearm abscess (POA) As evidenced by fever with a T-max of 99.5, tachycardia, leukocytosis and source of infection which is a right forearm abscess Lactic acid is within normal limits Patient has a history of IV drug abuse and last use was 3 days prior  to his hospitalization Keep right upper extremity elevated Pain control IV antibiotic therapy with vancomycin Surgical consult for incision and drainage Follow-up results of blood cultures Continue IV fluid hydration   Opioid abuse Continue Suboxone   DVT prophylaxis: SCD Code Status: Full Family Communication: Greater than 50% of time was spent discussing plan of care with patient at the bedside, he verbalizes understanding and agrees with the plan. Disposition Plan: Back to previous home environment Consults called: Surgery    Cordon Gassett MD [facility]     12/04/2019, 4:37 PM

## 2019-12-04 NOTE — Progress Notes (Signed)
Brief Progress Note  Asked to see the patient in consult for upper extremity abscess. Patient had signed out AMA prior to my evaluation. D/W ED staff, apparently patient will reportedly come back. Please re-consult.   -- Lynden Oxford, PA-C [facility] 12/04/2019, 12:54 PM [phone] M-F: 7am - 4pm

## 2019-12-04 NOTE — Progress Notes (Addendum)
CODE SEPSIS - PHARMACY COMMUNICATION  **Broad Spectrum Antibiotics should be administered within 1 hour of Sepsis diagnosis**  Time Code Sepsis Called/Page Received: 1425  Antibiotics Ordered: Vancomycin, ceftriaxone  Time of 1st antibiotic administration: Pt was here earlier this AM and received vancomycin 1g IV x1 at 1114; ceftriaxone at 1459  Additional action taken by pharmacy: Additional vancomycin 500 mg IV x1 to complete load, discussed plan with RN  If necessary, Name of Provider/Nurse Contacted:     Marty Heck ,PharmD Clinical Pharmacist  12/04/2019  2:42 PM

## 2019-12-04 NOTE — ED Notes (Signed)
Admitting MD bedside

## 2019-12-04 NOTE — ED Provider Notes (Signed)
[facility] Emergency Department Provider Note   ____________________________________________   First MD Initiated Contact with Patient 12/04/19 [phone]     (approximate)  I have reviewed the triage vital signs and the nursing notes.   HISTORY  Chief Complaint Abscess    HPI [name] is a 24 y.o. male patient presents with redness swelling to the right forearm.  Patient admits to "shooting up meth" 3 days ago.  Patient stated last day area has worsened with redness and edema.  No palliative measure for complaint.  Denies loss of sensation or function.  Rates his pain as a 2/10.  Describes the pain as "sore".         Past Medical History:  Diagnosis Date  . Opioid dependence (HCC)    heroin abuse    Patient Active Problem List   Diagnosis Date Noted  . Opiate overdose (HCC) 11/23/2018  . Suicide attempt by heroin overdose (HCC) 11/22/2018  . Substance induced mood disorder (HCC) 11/22/2018  . Opioid abuse (HCC) 11/22/2018  . MDD (major depressive disorder), recurrent severe, without psychosis (HCC) 11/22/2018    History reviewed. No pertinent surgical history.  Prior to Admission medications   Medication Sig Start Date End Date Taking? Authorizing Provider  Buprenorphine HCl-Naloxone HCl 8-2 MG FILM Place 0.5 Film under the tongue daily. 11/13/18   [provider]    Allergies Patient has no known allergies.  No family history on file.  Social History Social History   Tobacco Use  . Smoking status: Current Some Day Smoker  . Smokeless tobacco: Never Used  Substance Use Topics  . Alcohol use: Never  . Drug use: Yes    Types: IV    Comment: heroin    Review of Systems Constitutional: No fever/chills Eyes: No visual changes. ENT: No sore throat. Cardiovascular: Denies chest pain. Respiratory: Denies shortness of breath. Gastrointestinal: No abdominal pain.  No nausea, no vomiting.  No diarrhea.  No  constipation. Genitourinary: Negative for dysuria. Musculoskeletal: Negative for back pain. Skin: Negative for rash.  Redness and swelling. Neurological: Negative for headaches, focal weakness or numbness. Psychiatric:  Drug abuse.  Major depression disorder.   ____________________________________________   PHYSICAL EXAM:  VITAL SIGNS: ED Triage Vitals  Enc Vitals Group     BP 12/04/19 0822 116/76     Pulse Rate 12/04/19 0822 92     Resp 12/04/19 0822 20     Temp 12/04/19 0822 98.4 F (36.9 C)     Temp Source 12/04/19 0822 Oral     SpO2 12/04/19 0822 100 %     Weight 12/04/19 0821 165 lb (74.8 kg)     Height 12/04/19 0821 6\' 1"  (1.854 m)     Head Circumference --      Peak Flow --      Pain Score 12/04/19 0829 2     Pain Loc --      Pain Edu? --      Excl. in [place]? --    Constitutional: Alert and oriented. Well appearing and in no acute distress. Cardiovascular: Normal rate, regular rhythm. Grossly normal heart sounds.  Good peripheral circulation. Respiratory: Normal respiratory effort.  No retractions. Lungs CTAB. Neurologic:  Normal speech and language. No gross focal neurologic deficits are appreciated. No gait instability. Skin: Edema and erythema to right forearm. Psychiatric: Mood and affect are normal. Speech and behavior are normal.  ____________________________________________   LABS (all labs ordered are listed, but only abnormal results  are displayed)  Labs Reviewed  BASIC METABOLIC PANEL - Abnormal; Notable for the following components:      Result Value   Sodium 134 (*)    Glucose, Bld 108 (*)    Calcium 8.5 (*)    All other components within normal limits  CBC WITH DIFFERENTIAL/PLATELET - Abnormal; Notable for the following components:   WBC 16.3 (*)    RBC 3.99 (*)    Hemoglobin 12.1 (*)    HCT 34.8 (*)    Neutro Abs 12.9 (*)    Monocytes Absolute 1.2 (*)    All other components within normal limits  CULTURE, BLOOD (ROUTINE X 2)  CULTURE,  BLOOD (ROUTINE X 2)  RESPIRATORY PANEL BY RT PCR (FLU A&B, COVID)   ____________________________________________  EKG   ____________________________________________  RADIOLOGY  ED MD interpretation:    Official radiology report(s): [place] RT UPPER EXTREM LTD SOFT TISSUE NON VASCULAR  Result Date: 12/04/2019 CLINICAL DATA:  Right forearm swelling and erythema. EXAM: ULTRASOUND RIGHT  UPPER EXTREMITY LIMITED TECHNIQUE: Ultrasound examination of the upper extremity soft tissues was performed in the area of clinical concern. COMPARISON:  None. FINDINGS: There is a poorly defined 5.1 x 1.3 x 2.8 cm complex fluid collection in the subcutaneous fat of the forearm with a connecting 2.5 x 1.0 x 2.3 cm complex superficial fluid collection. There appears to be a thrombosed vein running through these areas. IMPRESSION: Poorly defined abscess in the subcutaneous soft tissues of the right forearm surrounding a thrombosed vein. Electronically Signed   By: Lorriane Shire M.D.   On: 12/04/2019 09:20    ____________________________________________   PROCEDURES  Procedure(s) performed (including Critical Care):  Procedures   ____________________________________________   INITIAL IMPRESSION / ASSESSMENT AND PLAN / ED COURSE  As part of my medical decision making, I reviewed the following data within the [place]     Patient presents with right forearm cellulitis and abscess secondary to IV drug abuse.  Patient also has thrombosed vein surrounding the area of concern.  Patient will be admitted for IV antibiotics and surgical consideration.   James Holloway was evaluated in Emergency Department on 12/04/2019 for the symptoms described in the history of present illness. He was evaluated in the context of the global COVID-19 pandemic, which necessitated consideration that the patient might be at risk for infection with the SARS-CoV-2 virus that causes COVID-19. Institutional  protocols and algorithms that pertain to the evaluation of patients at risk for COVID-19 are in a state of rapid change based on information released by regulatory bodies including the CDC and federal and state organizations. These policies and algorithms were followed during the patient's care in the ED.       ____________________________________________   FINAL CLINICAL IMPRESSION(S) / ED DIAGNOSES  Final diagnoses:  Abscess  Thrombosis of arm, right  Cellulitis of right forearm     ED Discharge Orders    None       Note:  This document was prepared using Dragon voice recognition software and may include unintentional dictation errors.    Sable Feil, PA-C 12/04/19 1021    Vanessa [place], MD 12/09/19 1455

## 2019-12-04 NOTE — ED Notes (Signed)
Pt here earlier, was supposed to have consult with surgery for abscess on his arm. Pt had to leave and is now checking back in.

## 2019-12-04 NOTE — ED Notes (Signed)
First Nurse Note: Pt to ED via POV c/o pain and swelling in his right forearm. Pt is ambulatory and in NAD.

## 2019-12-04 NOTE — Progress Notes (Signed)
Notified provider of need to order lactic acid. Will continue to monitor.

## 2019-12-04 NOTE — ED Triage Notes (Signed)
Presents with redness and swelling to right forearm  States he "shot up meth" 3 days ago area has gotten larger since  Redness from mid upper arm into forearm

## 2019-12-04 NOTE — Progress Notes (Addendum)
PHARMACY -  BRIEF ANTIBIOTIC NOTE   Pharmacy has received consult(s) for vancomycin from an ED provider.  The patient's profile has been reviewed for ht/wt/allergies/indication/available labs.    Wt 74.8 kg Pt was here earlier this morning and received vancomycin 1g IV x1 at 1114.  One time order(s) placed for vancomycin 500 mg IV x1 to complete loading dose of 1500 mg. Discussed plan with RN.   Further antibiotics/pharmacy consults should be ordered by admitting physician if indicated.                       Thank you, Marty Heck 12/04/2019  2:32 PM

## 2019-12-04 NOTE — ED Notes (Signed)
Pt given warm blankets per request.

## 2019-12-04 NOTE — ED Provider Notes (Signed)
[facility] Emergency Department Provider Note  ____________________________________________   First MD Initiated Contact with Patient 12/04/19 1423     (approximate)  I have reviewed the triage vital signs and the nursing notes.   HISTORY  Chief Complaint Abscess    HPI James Holloway is a 24 y.o. male with history of IV drug abuse who comes in with concern for abscess and redness of his right forearm.  Patient has shooting up meth 3 days ago.  Endorses feeling the discoloration 3 days ago, constant, severe, worsening.  Nothing has made it better, has not been on antibiotics.  Nothing is made it worse.  Does have some associated pain with.  Denies any known fevers.  Patient still able to range the joint.  Patient was seen earlier and plan was to admit with surgical consultation but patient had to leave in order to get his child somewhere states that he is now ready to be admitted          Past Medical History:  Diagnosis Date  . Opioid dependence ([place])    heroin abuse    Patient Active Problem List   Diagnosis Date Noted  . Abscess of right forearm 12/04/2019  . Opiate overdose ([place]) 11/23/2018  . Suicide attempt by heroin overdose ([place]) 11/22/2018  . Substance induced mood disorder ([place]) 11/22/2018  . Opioid abuse ([place]) 11/22/2018  . MDD (major depressive disorder), recurrent severe, without psychosis ([place]) 11/22/2018    History reviewed. No pertinent surgical history.  Prior to Admission medications   Medication Sig Start Date End Date Taking? Authorizing Provider  Buprenorphine HCl-Naloxone HCl 8-2 MG FILM Place 1 Film under the tongue daily.    Yes [provider]    Allergies Patient has no known allergies.  No family history on file.  Social History Social History   Tobacco Use  . Smoking status: Current Some Day Smoker  . Smokeless tobacco: Never Used  Substance Use Topics  . Alcohol use: Never  . Drug use:  Yes    Types: IV, Methamphetamines    Comment: heroin      Review of Systems Constitutional: No fever/chills Eyes: No visual changes. ENT: No sore throat. Cardiovascular: Denies chest pain. Respiratory: Denies shortness of breath. Gastrointestinal: No abdominal pain.  No nausea, no vomiting.  No diarrhea.  No constipation. Genitourinary: Negative for dysuria. Musculoskeletal: Right arm swelling and redness Skin: Negative for rash. Neurological: Negative for headaches, focal weakness or numbness. All other ROS negative ____________________________________________   PHYSICAL EXAM:  VITAL SIGNS: ED Triage Vitals [12/04/19 1412]  Enc Vitals Group     BP (!) 147/87     Pulse Rate (!) 132     Resp 20     Temp 99.5 F (37.5 C)     Temp Source Oral     SpO2 97 %     Weight      Height      Head Circumference      Peak Flow      Pain Score      Pain Loc      Pain Edu?      Excl. in [place]?     Constitutional: Alert and oriented. Well appearing and in no acute distress. Eyes: Conjunctivae are normal. EOMI. Head: Atraumatic. Nose: No congestion/rhinnorhea. Mouth/Throat: Mucous membranes are moist.   Neck: No stridor. Trachea Midline. FROM Cardiovascular: Tachycardic, regular rhythm. Grossly normal heart sounds.  Good peripheral circulation. Respiratory: Normal respiratory effort.  No retractions. Lungs CTAB. Gastrointestinal: Soft and nontender. No distention. No abdominal bruits.  Musculoskeletal: Significant redness and swelling of the right forearm.  Still able to range the right elbow.  No active drainage Neurologic:  Normal speech and language. No gross focal neurologic deficits are appreciated.  Skin:  Skin is warm, dry and intact. No rash noted. Psychiatric: Mood and affect are normal. Speech and behavior are normal. GU: Deferred   ____________________________________________   LABS (all labs ordered are listed, but only abnormal results are displayed)  Labs  Reviewed  LACTIC ACID, PLASMA  LACTIC ACID, PLASMA  URINE DRUG SCREEN, QUALITATIVE ([facility] ONLY)   ____________________________________________  RADIOLOGY  Official radiology report(s): [place] RT UPPER EXTREM LTD SOFT TISSUE NON VASCULAR  Result Date: 12/04/2019 CLINICAL DATA:  Right forearm swelling and erythema. EXAM: ULTRASOUND RIGHT  UPPER EXTREMITY LIMITED TECHNIQUE: Ultrasound examination of the upper extremity soft tissues was performed in the area of clinical concern. COMPARISON:  None. FINDINGS: There is a poorly defined 5.1 x 1.3 x 2.8 cm complex fluid collection in the subcutaneous fat of the forearm with a connecting 2.5 x 1.0 x 2.3 cm complex superficial fluid collection. There appears to be a thrombosed vein running through these areas. IMPRESSION: Poorly defined abscess in the subcutaneous soft tissues of the right forearm surrounding a thrombosed vein. Electronically Signed   By: Francene Boyers M.D.   On: 12/04/2019 09:20    ____________________________________________   PROCEDURES  Procedure(s) performed (including Critical Care):  .Critical Care Performed by: Concha Se, MD Authorized by: Concha Se, MD   Critical care provider statement:    Critical care time (minutes):  31   Critical care was necessary to treat or prevent imminent or life-threatening deterioration of the following conditions:  Sepsis   Critical care was time spent personally by me on the following activities:  Discussions with consultants, evaluation of patient's response to treatment, examination of patient, ordering and performing treatments and interventions, ordering and review of laboratory studies, ordering and review of radiographic studies, pulse oximetry, re-evaluation of patient's condition, obtaining history from patient or surrogate and review of old charts     ____________________________________________   INITIAL IMPRESSION / ASSESSMENT AND PLAN / ED COURSE  James Holloway was evaluated in Emergency Department on 12/04/2019 for the symptoms described in the history of present illness. He was evaluated in the context of the global COVID-19 pandemic, which necessitated consideration that the patient might be at risk for infection with the SARS-CoV-2 virus that causes COVID-19. Institutional protocols and algorithms that pertain to the evaluation of patients at risk for COVID-19 are in a state of rapid change based on information released by regulatory bodies including the CDC and federal and state organizations. These policies and algorithms were followed during the patient's care in the ED.    Patient comes in meeting sepsis criteria with elevated heart rate and elevated white count from labs done earlier today.  Will give patient fluid resuscitation.  Patient already received vancomycin.  We will give a dose of ceftriaxone.  Patient already had DVT ultrasound done that shows concern for fluid collection.  Patient has full range of motion of the elbow so I have low suspicion for septic joint at this time.  This is most concerning for sepsis, cellulitis.  No evidence of DVT.  We discussed with the hospital team for readmission and let the surgery team know patient is here  On reevaluation after the fluids patient's heart  rates come down to the 90s.  Patient remains well-perfused with normal respiratory rate.  Lactate was normal       ____________________________________________   FINAL CLINICAL IMPRESSION(S) / ED DIAGNOSES   Final diagnoses:  Sepsis, due to unspecified organism, unspecified whether acute organ dysfunction present (HCC)  Cellulitis of right upper extremity      MEDICATIONS GIVEN DURING THIS VISIT:  Medications  sodium chloride 0.9 % bolus 1,000 mL (1,000 mLs Intravenous New Bag/Given 12/04/19 1459)    And  sodium chloride 0.9 % bolus 1,000 mL (has no administration in time range)    And  sodium chloride 0.9 % bolus 250 mL (has no  administration in time range)  vancomycin (VANCOREADY) IVPB 500 mg/100 mL (has no administration in time range)  cefTRIAXone (ROCEPHIN) 2 g in sodium chloride 0.9 % 100 mL IVPB (2 g Intravenous New Bag/Given 12/04/19 1459)     ED Discharge Orders    None       Note:  This document was prepared using Dragon voice recognition software and may include unintentional dictation errors.   Concha Se, MD 12/04/19 1535

## 2019-12-04 NOTE — Consult Note (Addendum)
[facility] SUR[facility]ICAL CONSULTATION NOTE (initial) - cpt: 68127   HISTORY OF PRESENT ILLNESS (HPI):  24 y.o. male presented to [facility] ED today for evaluation of right forearm abscess. Patient reports that he used IV Drug (Meth) and he thinks it was a "bad batch." Since that time, he has noticed worsening pain, swelling, and erythema to the right forearm distal to the elbow. Erythema continues to track up the extremity. No fevers or chills at home. No other complaints. Work up in the ED was concerning for leukocytosis to 16K and large abscess to the right upper extremity.  Of note, he does endorse PO intake today.   Surgery is consulted by emergency medicine physician Dr. Marjean Donna MD in this context for evaluation and management of right upper extremity abscess.   PAST MEDICAL HISTORY (PMH):  Past Medical History:  Diagnosis Date  . Opioid dependence ([place])    heroin abuse     PAST SURGICAL HISTORY ([place]):  History reviewed. No pertinent surgical history.   MEDICATIONS:  Prior to Admission medications   Medication Sig Start Date End Date Taking? Authorizing Provider  Buprenorphine HCl-Naloxone HCl 8-2 MG FILM Place 1 Film under the tongue daily.    Yes [provider]     ALLERGIES:  No Known Allergies   SOCIAL HISTORY:  Social History   Socioeconomic History  . Marital status: Single    Spouse name: Not on file  . Number of children: Not on file  . Years of education: Not on file  . Highest education level: Not on file  Occupational History  . Not on file  Tobacco Use  . Smoking status: Current Some Day Smoker  . Smokeless tobacco: Never Used  Substance and Sexual Activity  . Alcohol use: Never  . Drug use: Yes    Types: IV, Methamphetamines    Comment: heroin  . Sexual activity: Not on file  Other Topics Concern  . Not on file  Social History Narrative  . Not on file   Social Determinants of Health   Financial Resource Strain:   .  Difficulty of Paying Living Expenses:   Food Insecurity:   . Worried About Charity fundraiser in the Last Year:   . Arboriculturist in the Last Year:   Transportation Needs:   . Film/video editor (Medical):   Marland Kitchen Lack of Transportation (Non-Medical):   Physical Activity:   . Days of Exercise per Week:   . Minutes of Exercise per Session:   Stress:   . Feeling of Stress :   Social Connections:   . Frequency of Communication with Friends and Family:   . Frequency of Social Gatherings with Friends and Family:   . Attends Religious Services:   . Active Member of Clubs or Organizations:   . Attends Archivist Meetings:   Marland Kitchen Marital Status:   Intimate Partner Violence:   . Fear of Current or Ex-Partner:   . Emotionally Abused:   Marland Kitchen Physically Abused:   . Sexually Abused:      FAMILY HISTORY:  No family history on file.    REVIEW OF SYSTEMS:  Review of Systems  Constitutional: Negative for chills and fever.  Respiratory: Negative for cough and shortness of breath.   Cardiovascular: Negative for chest pain and palpitations.  Genitourinary: Negative for dysuria and urgency.  Skin:       + RUE Abscess  All other systems reviewed and are negative.  VITAL SIGNS:  Temp:  [98.4 F (36.9 C)-99.5 F (37.5 C)] 99.5 F (37.5 C) (05/03 1412) Pulse Rate:  [88-132] 132 (05/03 1412) Resp:  [18-20] 20 (05/03 1412) BP: (116-147)/(70-87) 147/87 (05/03 1412) SpO2:  [97 %-100 %] 97 % (05/03 1412) Weight:  [74.8 kg] 74.8 kg (05/03 0821)             INTAKE/OUTPUT:  No intake/output data recorded.  PHYSICAL EXAM:  Physical Exam Vitals and nursing note reviewed.  Constitutional:      General: He is not in acute distress.    Appearance: Normal appearance. He is normal weight. He is not ill-appearing.     Comments: Somewhat disheveled appearance   HENT:     Head: Normocephalic.  Eyes:     General: No scleral icterus.    Conjunctiva/sclera: Conjunctivae normal.    Cardiovascular:     Rate and Rhythm: Normal rate and regular rhythm.     Pulses: Normal pulses.  Pulmonary:     Effort: Pulmonary effort is normal. No respiratory distress.  Genitourinary:    Comments: Deferred Skin:    General: Skin is warm and dry.     Findings: Abscess and erythema present.       Neurological:     General: No focal deficit present.     Mental Status: He is alert and oriented to person, place, and time.  Psychiatric:        Mood and Affect: Mood normal.        Behavior: Behavior normal.    Right Forearm (12/04/2019):      Labs:  CBC Latest Ref Rng & Units 12/04/2019 11/22/2018 07/12/2018  WBC 4.0 - 10.5 K/uL 16.3(H) 8.1 11.3(H)  Hemoglobin 13.0 - 17.0 g/dL 12.1(L) 13.4 14.8  Hematocrit 39.0 - 52.0 % 34.8(L) 40.1 45.3  Platelets 150 - 400 K/uL 270 181 213   CMP Latest Ref Rng & Units 12/04/2019 11/22/2018 07/12/2018  Glucose 70 - 99 mg/dL 599(J) 570(V) 779(T)  BUN 6 - 20 mg/dL 9 16 14   Creatinine 0.61 - 1.24 mg/dL 9.03) 0.09(Q  Sodium 135 - 145 mmol/L 134(L) 139 139  Potassium 3.5 - 5.1 mmol/L 3.8 3.1(L) 3.3(L)  Chloride 98 - 111 mmol/L 99 98 105  CO2 22 - 32 mmol/L 27 28 28   Calcium 8.9 - 10.3 mg/dL 3.30) 9.0 )  Total Protein 6.5 - 8.1 g/dL - 6.9 7.2  Total Bilirubin 0.3 - 1.2 mg/dL - 0.6 0.6  Alkaline Phos 38 - 126 U/L - 76 86  AST 15 - 41 U/L - 35 29  ALT 0 - 44 U/L - 16 19    Imaging studies:   0.7(M Soft Tissue (12/04/2019) personally reviewed and agree with radiologist report reviewed:  IMPRESSION: Poorly defined abscess in the subcutaneous soft tissues of the right forearm surrounding a thrombosed vein   Assessment/Plan: (ICD-10's: L37.413) 24 y.o. male with leukocytosis and erythema and fluctuance to the right forearm secondary to IVDA   - Admit to medicine; appreciate their assistance  - IV ABx (Vancomycin + Ceftriaxone)  - Pain control prn  - Okay for diet today, NPO after midnight  - Monitor WBC; fever curve; erythema  -  Will plan for I&D tomorrow with Dr L0.60   - Further management per primary service   All of the above findings and recommendations were discussed with the patient, and all of patient's questions were answered to his expressed satisfaction.  Thank you for the opportunity to participate in this  patient's care.   -- Lynden Oxford, PA-C [facility] 12/04/2019, 3:09 PM [phone] M-F: 7am - 4pm

## 2019-12-04 NOTE — Anesthesia Preprocedure Evaluation (Addendum)
Anesthesia Evaluation  Patient identified by MRN, date of birth, ID band Patient awake    Reviewed: Allergy & Precautions, NPO status , Patient's Chart, lab work & pertinent test results  Airway Mallampati: II       Dental   Pulmonary Current Smoker and Patient abstained from smoking.,    Pulmonary exam normal        Cardiovascular negative cardio ROS Normal cardiovascular exam     Neuro/Psych PSYCHIATRIC DISORDERS Depression negative neurological ROS     GI/Hepatic negative GI ROS, (+)     substance abuse  ,   Endo/Other  negative endocrine ROS  Renal/GU negative Renal ROS  negative genitourinary   Musculoskeletal  (+) narcotic dependent  Abdominal   Peds negative pediatric ROS (+)  Hematology negative hematology ROS (+)   Anesthesia Other Findings Past Medical History: No date: Opioid dependence (HCC)     Comment:  heroin abuse   Reproductive/Obstetrics                            Anesthesia Physical Anesthesia Plan  ASA: II  Anesthesia Plan: General   Post-op Pain Management:    Induction: Intravenous  PONV Risk Score and Plan:   Airway Management Planned: LMA and Oral ETT  Additional Equipment:   Intra-op Plan:   Post-operative Plan: Extubation in OR  Informed Consent: I have reviewed the patients History and Physical, chart, labs and discussed the procedure including the risks, benefits and alternatives for the proposed anesthesia with the patient or authorized representative who has indicated his/her understanding and acceptance.     Dental advisory given  Plan Discussed with: CRNA and Surgeon  Anesthesia Plan Comments:       Anesthesia Quick Evaluation

## 2019-12-04 NOTE — Progress Notes (Signed)
Pt had BC drawn on prior admission this morning at 0851 & 0933.

## 2019-12-04 NOTE — Progress Notes (Signed)
Pharmacy Antibiotic Note  James Holloway is a 24 y.o. male admitted on 12/04/2019 with right forearm abscess.  Pharmacy has been consulted for vancomycin dosing.  Pt received total of vancomycin 1500 mg IV loading dose in the ED on 5/3  Plan: Vancomycin 1000 mg IV Q 8 hrs. Goal AUC 400-550. Expected AUC: 460 Expected Cmin: 12.8 SCr used: 0.85  Follow up renal function in AM.     Temp (24hrs), Avg:99 F (37.2 C), Min:98.4 F (36.9 C), Max:99.5 F (37.5 C)  Recent Labs  Lab 12/04/19 0851 12/04/19 1449  WBC 16.3*  --   CREATININE 0.85  --   LATICACIDVEN  --  1.2    Estimated Creatinine Clearance: 140.6 mL/min (by C-G formula based on SCr of 0.85 mg/dL).    No Known Allergies  Antimicrobials this admission: Vancomycin 5/3 >> Ceftriaxone 5/3 x1  Dose adjustments this admission:   Microbiology results: 5/3 BCx x2 pending   Thank you for allowing pharmacy to be a part of this patient's care.  Marty Heck 12/04/2019 4:53 PM

## 2019-12-05 ENCOUNTER — Encounter: Payer: Self-pay | Admitting: Internal Medicine

## 2019-12-05 ENCOUNTER — Encounter
Admission: EM | Disposition: A | Discharge status: Home Health | Payer: Self-pay | Source: Home / Self Care | Attending: Internal Medicine

## 2019-12-05 ENCOUNTER — Inpatient Hospital Stay: Payer: Self-pay | Admitting: Anesthesiology

## 2019-12-05 ENCOUNTER — Other Ambulatory Visit: Payer: Self-pay

## 2019-12-05 DIAGNOSIS — F1990 Other psychoactive substance use, unspecified, uncomplicated: Secondary | ICD-10-CM

## 2019-12-05 HISTORY — PX: INCISION AND DRAINAGE ABSCESS: SHX5864

## 2019-12-05 LAB — CBC
HCT: 35.3 % — ABNORMAL LOW (ref 39.0–52.0)
Hemoglobin: 11.8 g/dL — ABNORMAL LOW (ref 13.0–17.0)
MCH: 29.4 pg (ref 26.0–34.0)
MCHC: 33.4 g/dL (ref 30.0–36.0)
MCV: 88 fL (ref 80.0–100.0)
Platelets: 272 10*3/uL (ref 150–400)
RBC: 4.01 MIL/uL — ABNORMAL LOW (ref 4.22–5.81)
RDW: 11.9 % (ref 11.5–15.5)
WBC: 14.5 10*3/uL — ABNORMAL HIGH (ref 4.0–10.5)
nRBC: 0 % (ref 0.0–0.2)

## 2019-12-05 LAB — BASIC METABOLIC PANEL
Anion gap: 6 (ref 5–15)
BUN: 9 mg/dL (ref 6–20)
CO2: 26 mmol/L (ref 22–32)
Calcium: 8.4 mg/dL — ABNORMAL LOW (ref 8.9–10.3)
Chloride: 105 mmol/L (ref 98–111)
Creatinine, Ser: 0.82 mg/dL (ref 0.61–1.24)
GFR calc Af Amer: >60 mL/min (ref >60)
GFR calc non Af Amer: >60 mL/min (ref >60)
Glucose, Bld: 117 mg/dL — ABNORMAL HIGH (ref 70–99)
Potassium: 4 mmol/L (ref 3.5–5.1)
Sodium: 137 mmol/L (ref 135–145)

## 2019-12-05 LAB — HIV ANTIBODY (ROUTINE TESTING W REFLEX): HIV Screen 4th Generation wRfx: NONREACTIVE

## 2019-12-05 SURGERY — INCISION AND DRAINAGE, ABSCESS
Anesthesia: General | Site: Arm Lower | Laterality: Right

## 2019-12-05 MED ORDER — FAMOTIDINE 20 MG PO TABS
ORAL_TABLET | ORAL | Status: AC
  Administered 2019-12-05: 20 mg via ORAL
  Filled 2019-12-05: qty 1

## 2019-12-05 MED ORDER — HYDROCODONE-ACETAMINOPHEN 10-325 MG PO TABS
2.0000 | ORAL_TABLET | Freq: Four times a day (QID) | ORAL | Status: DC | PRN
  Administered 2019-12-05: 2 via ORAL
  Filled 2019-12-05: qty 2

## 2019-12-05 MED ORDER — FAMOTIDINE 20 MG PO TABS: 20.0000 mg | ORAL_TABLET | Freq: Once | ORAL | Status: AC

## 2019-12-05 MED ORDER — FENTANYL CITRATE (PF) 100 MCG/2ML IJ SOLN
25.0000 ug | INTRAMUSCULAR | Status: AC | PRN
  Administered 2019-12-05 (×6): 25 ug via INTRAVENOUS

## 2019-12-05 MED ORDER — VANCOMYCIN HCL 1000 MG IV SOLR
INTRAVENOUS | Status: DC | PRN
  Administered 2019-12-05: 08:00:00 1000 mg via INTRAVENOUS

## 2019-12-05 MED ORDER — FENTANYL CITRATE (PF) 100 MCG/2ML IJ SOLN
INTRAMUSCULAR | Status: AC
  Filled 2019-12-05: qty 2

## 2019-12-05 MED ORDER — BUPIVACAINE LIPOSOME 1.3 % IJ SUSP
INTRAMUSCULAR | Status: AC
  Filled 2019-12-05: qty 20

## 2019-12-05 MED ORDER — BUPIVACAINE-EPINEPHRINE (PF) 0.25% -1:200000 IJ SOLN
INTRAMUSCULAR | Status: AC
  Filled 2019-12-05: qty 30

## 2019-12-05 MED ORDER — MIDAZOLAM HCL 2 MG/2ML IJ SOLN
INTRAMUSCULAR | Status: AC
  Filled 2019-12-05: qty 2

## 2019-12-05 MED ORDER — PROPOFOL 10 MG/ML IV BOLUS
INTRAVENOUS | Status: AC
  Filled 2019-12-05: qty 20

## 2019-12-05 MED ORDER — BUPIVACAINE-EPINEPHRINE 0.25% -1:200000 IJ SOLN
INTRAMUSCULAR | Status: DC | PRN
  Administered 2019-12-05: 30 mg

## 2019-12-05 MED ORDER — ONDANSETRON HCL 4 MG/2ML IJ SOLN
INTRAMUSCULAR | Status: AC
  Filled 2019-12-05: qty 2

## 2019-12-05 MED ORDER — ONDANSETRON HCL 4 MG/2ML IJ SOLN: 4.0000 mg | Freq: Once | INTRAMUSCULAR | Status: DC | PRN

## 2019-12-05 MED ORDER — PROPOFOL 10 MG/ML IV BOLUS
INTRAVENOUS | Status: DC | PRN
  Administered 2019-12-05: 50 mg via INTRAVENOUS
  Administered 2019-12-05: 200 mg via INTRAVENOUS

## 2019-12-05 MED ORDER — FENTANYL CITRATE (PF) 100 MCG/2ML IJ SOLN
INTRAMUSCULAR | Status: AC
  Administered 2019-12-05: 25 ug via INTRAVENOUS
  Filled 2019-12-05: qty 2

## 2019-12-05 MED ORDER — VANCOMYCIN HCL IN DEXTROSE 1-5 GM/200ML-% IV SOLN
INTRAVENOUS | Status: AC
  Filled 2019-12-05: qty 200

## 2019-12-05 MED ORDER — LACTATED RINGERS IV SOLN: INTRAVENOUS | Status: DC

## 2019-12-05 MED ORDER — FENTANYL CITRATE (PF) 100 MCG/2ML IJ SOLN
INTRAMUSCULAR | Status: DC | PRN
  Administered 2019-12-05 (×2): 50 ug via INTRAVENOUS

## 2019-12-05 MED ORDER — LIDOCAINE HCL (CARDIAC) PF 100 MG/5ML IV SOSY
PREFILLED_SYRINGE | INTRAVENOUS | Status: DC | PRN
  Administered 2019-12-05: 100 mg via INTRAVENOUS

## 2019-12-05 MED ORDER — FENTANYL CITRATE (PF) 100 MCG/2ML IJ SOLN
INTRAMUSCULAR | Status: AC
  Administered 2019-12-05: 08:00:00 25 ug via INTRAVENOUS
  Filled 2019-12-05: qty 2

## 2019-12-05 MED ORDER — MIDAZOLAM HCL 2 MG/2ML IJ SOLN
INTRAMUSCULAR | Status: DC | PRN
  Administered 2019-12-05: 2 mg via INTRAVENOUS

## 2019-12-05 SURGICAL SUPPLY — 34 items
BLADE SURG 15 STRL LF DISP TIS (BLADE) ×1 IMPLANT
BLADE SURG 15 STRL SS (BLADE) ×2
BNDG GAUZE 4.5X4.1 6PLY STRL (MISCELLANEOUS) ×3 IMPLANT
CANISTER SUCT 3000ML PPV (MISCELLANEOUS) ×3 IMPLANT
CHLORAPREP W/TINT 26 (MISCELLANEOUS) ×3 IMPLANT
COVER WAND RF STERILE (DRAPES) ×3 IMPLANT
DRAIN PENROSE 1/4X12 LTX STRL (WOUND CARE) IMPLANT
DRAIN PENROSE 5/8X18 LTX STRL (DRAIN) IMPLANT
DRAPE 3/4 80X56 (DRAPES) ×3 IMPLANT
DRAPE IMP U-DRAPE 54X76 (DRAPES) ×3 IMPLANT
DRAPE LAPAROTOMY 77X122 PED (DRAPES) ×3 IMPLANT
DRSG GAUZE FLUFF 36X18 (GAUZE/BANDAGES/DRESSINGS) ×3 IMPLANT
ELECT CAUTERY BLADE 6.4 (BLADE) ×3 IMPLANT
ELECT REM PT RETURN 9FT ADLT (ELECTROSURGICAL) ×3
ELECTRODE REM PT RTRN 9FT ADLT (ELECTROSURGICAL) ×1 IMPLANT
GAUZE PACKING 1/4 X5 YD (GAUZE/BANDAGES/DRESSINGS) ×3 IMPLANT
GAUZE PETROLATUM 1 X8 (GAUZE/BANDAGES/DRESSINGS) ×3 IMPLANT
GAUZE SPONGE 4X4 12PLY STRL (GAUZE/BANDAGES/DRESSINGS) IMPLANT
GLOVE ORTHO TXT STRL SZ7.5 (GLOVE) ×12 IMPLANT
GOWN STRL REUS W/ TWL LRG LVL3 (GOWN DISPOSABLE) ×3 IMPLANT
GOWN STRL REUS W/TWL LRG LVL3 (GOWN DISPOSABLE) ×6
KIT TURNOVER KIT A (KITS) ×3 IMPLANT
NEEDLE HYPO 22GX1.5 SAFETY (NEEDLE) ×3 IMPLANT
NS IRRIG 1000ML POUR BTL (IV SOLUTION) ×3 IMPLANT
PACK BASIN MINOR (MISCELLANEOUS) ×3 IMPLANT
PAD ABD DERMACEA PRESS 5X9 (GAUZE/BANDAGES/DRESSINGS) IMPLANT
SOL PREP PVP 2OZ (MISCELLANEOUS)
SOLUTION PREP PVP 2OZ (MISCELLANEOUS) IMPLANT
SPONGE LAP 18X18 RF (DISPOSABLE) ×3 IMPLANT
SUT ETHILON 3-0 FS-10 30 BLK (SUTURE)
SUTURE EHLN 3-0 FS-10 30 BLK (SUTURE) IMPLANT
SWAB CULTURE AMIES ANAERIB BLU (MISCELLANEOUS) ×3 IMPLANT
SYR 10ML LL (SYRINGE) ×3 IMPLANT
SYR BULB IRRIG 60ML STRL (SYRINGE) ×3 IMPLANT

## 2019-12-05 NOTE — Progress Notes (Signed)
PROGRESS NOTE  James Holloway IRS:[id number] DOB: Aug 14, 1995 DOA: 12/04/2019 PCP: Patient, No Pcp Per  Brief History   24 year old man PMH IV drug use presented with redness and swelling right forearm status post injecting methamphetamine into the right forearm 3 days prior.  Right formal ultrasound showed abscess. Admitted for sepsis secondary to right forearm abscess and surgical intervention.  A & P  Sepsis secondary to right forearm abscess --Clinically improving, status post OR incision and drainage right forearm abscess 5/3.  Continue antibiotics.  Further management in conjunction with surgery.  IV drug abuse --TOC consultation  Suspect opioid use disorder --Continue Suboxone  Disposition Plan:   Status is: Inpatient  Remains inpatient appropriate because:IV treatments appropriate due to intensity of illness or inability to take PO   Dispo: The patient is from: Home              Anticipated d/c is to: Home              Anticipated d/c date is: 2 days              Patient currently is not medically stable to d/c.  DVT prophylaxis: SCDs Code Status: Full Family Communication: none  Murray Hodgkins, MD  [facility] Direct contact: see [email or web address] (further directions at bottom of note if needed) 7PM-7AM contact night coverage as at bottom of note 12/05/2019, 1:57 PM  LOS: 1 day   Significant Hospital Events   . 5/3 admitted for right forearm abscess   Consults:  . General surgery   Procedures:  . 5/4 incision and drainage right forearm abscess  Significant Diagnostic Tests:  . Right upper extremity ultrasound showed poorly defined abscess right forearm.   Micro Data:  . Forearm culture pending . Blood cultures pending   Antimicrobials:  . Vancomycin 5/3 >  Interval History/Subjective  Feels okay.  Some pain in right arm.  Objective   Vitals:  Vitals:   12/05/19 1152 12/05/19 1247  BP: 123/73 119/72  Pulse: 62 64  Resp: 16 16  Temp: 98.7  F (37.1 C) 98.2 F (36.8 C)  SpO2: 100% 100%    Exam:  Constitutional.  Appears calm and comfortable. Respiratory.  Clear to auscultation bilaterally.  No wheezes, rales or rhonchi.  Normal respiratory effort. Cardiovascular.  Regular rate and rhythm.  No murmur, rub or gallop.  No lower extremity edema. Psychiatric.  Grossly normal mood and affect.  Speech fluent and appropriate.  I have personally reviewed the following:   Today's Data  . Metabolic panel unremarkable . WBC down to 14.5, hemoglobin stable 11.8.  Scheduled Meds: . buprenorphine-naloxone  1 tablet Sublingual Daily   Continuous Infusions: . sodium chloride    . vancomycin 1,000 mg (12/05/19 0024)    Principal Problem:   Sepsis ([place]) Active Problems:   Opioid abuse ([place])   MDD (major depressive disorder), recurrent severe, without psychosis ([place])   Abscess of right upper extremity   LOS: 1 day   How to contact the [facility] Attending or Consulting provider [place] or covering provider during after hours [place], for this patient?  1. Check the care team in [facility] and look for a) attending/consulting TRH provider listed and b) the [facility] team listed 2. Log into [email or web address] and use [place]'s universal password to access. If you do not have the password, please contact the hospital operator. 3. Locate the [facility] provider you are looking for under [facility] and page to a number that  you can be directly reached. 4. If you still have difficulty reaching the provider, please page the [facility] (Director on Call) for the Hospitalists listed on amion for assistance.

## 2019-12-05 NOTE — Anesthesia Postprocedure Evaluation (Signed)
Anesthesia Post Note  Patient: PHYLLIS WHITEFIELD  Procedure(s) Performed: INCISION AND DRAINAGE ABSCESS (Right Arm Lower)  Patient location during evaluation: PACU Anesthesia Type: General Level of consciousness: awake and alert and oriented Pain management: pain level controlled Vital Signs Assessment: post-procedure vital signs reviewed and stable Respiratory status: spontaneous breathing Cardiovascular status: blood pressure returned to baseline Anesthetic complications: no     Last Vitals:  Vitals:   12/05/19 1152 12/05/19 1247  BP: 123/73 119/72  Pulse: 62 64  Resp: 16 16  Temp: 37.1 C 36.8 C  SpO2: 100% 100%    Last Pain:  Vitals:   12/05/19 1247  TempSrc: Oral  PainSc:                  Bravlio Luca

## 2019-12-05 NOTE — Op Note (Signed)
Incision and drainage right forearm abscess  Pre-operative Diagnosis: Right forearm abscess.  Post-operative Diagnosis: same.    Surgeon: Campbell Lerner, M.D., FACS  Anesthesia: General   Findings:  Poorly defined seropurulent collections medial aspect of volar right forearm, mid length.  No evidence of deep compartment involvement.  Estimated Blood Loss: 15 mL          Specimens: Swab for repeat culture and sensitivity.          Complications: none              Procedure Details  The patient was seen again in the Holding Room. The benefits, complications, treatment options, and expected outcomes were discussed with the patient. The risks of bleeding, infection, recurrence of symptoms, failure to resolve symptoms, unanticipated injury, prosthetic placement, prosthetic infection, any of which could require further surgery were reviewed with the patient. The likelihood of improving the patient's symptoms with return to their baseline status is reasonable.  The patient and/or family concurred with the proposed plan, giving informed consent.  The patient was taken to Operating Room, identified and the procedure verified.    Prior to the induction of general anesthesia, antibiotic prophylaxis was administered. VTE prophylaxis was in place.  General anesthesia was then administered and tolerated well. After the induction, the patient was positioned in the supine position and the right forearm was prepped with Chloraprep and draped in the sterile fashion.   A Time Out was held and the above information confirmed.  The central focus, where the apparent thrombosed venous abscess is located, is now draining seropurulent fluid.  Is at this point where I entered bluntly into the subcutaneous space, and defined the cavity already created.  An incision was made longitudinally to open the space.  Then with blunt dissection then evaluated the extent of the infectious dissection.  There were no obvious  necrotic tissues, all tissues appeared viable with the exception of a thinning of the dermis at the area of drainage.  This was sharply excised.  The remaining cavity is widely open for drainage.  Is then irrigated, and judicious hemostasis obtained with limited electrocautery.  Oozing is present as expected for this hyper inflammatory state.  Estimated wound size at end of procedure is 15 cm in length 3 cm in depth, 1 cm of undermining medially.  The width of the wound after the incision expanded due to the edema approximately 3 cm wide.  I expect this to diminish as his edema diminishes. I then infiltrated the surrounding tissues with quarter percent Marcaine with epinephrine.  Packed the wound with quarter inch plain packing strip, and then wet it with the local anesthetic.  Covered it with Vaseline gauze.  Fluffs for drainage, Kerlix to secure an Ace wrap to secure the Kerlix. Patient appeared to tolerate procedure well.      Campbell Lerner M.D., [facility] 12/05/2019 8:15 AM

## 2019-12-05 NOTE — Anesthesia Postprocedure Evaluation (Deleted)
Anesthesia Post Note  Patient: James Holloway  Procedure(s) Performed: INCISION AND DRAINAGE ABSCESS (Right Arm Lower)  Patient location during evaluation: PACU Anesthesia Type: General Level of consciousness: sedated Vital Signs Assessment: post-procedure vital signs reviewed and stable Respiratory status: spontaneous breathing Cardiovascular status: stable Anesthetic complications: no     Last Vitals:  Vitals:   12/05/19 0640 12/05/19 0818  BP: 113/68 114/67  Pulse: 71 84  Resp: 16 16  Temp: 36.8 C 36.5 C  SpO2: 100% 100%    Last Pain:  Vitals:   12/05/19 0818  TempSrc:   PainSc: Asleep                 Jaye Beagle

## 2019-12-05 NOTE — Transfer of Care (Signed)
Immediate Anesthesia Transfer of Care Note  Patient: James Holloway  Procedure(s) Performed: INCISION AND DRAINAGE ABSCESS (Right Arm Lower)  Patient Location: PACU  Anesthesia Type:General  Level of Consciousness: drowsy  Airway & Oxygen Therapy: Patient Spontanous Breathing  Post-op Assessment: Report given to RN  Post vital signs: Reviewed  Last Vitals:  Vitals Value Taken Time  BP 114/67 12/05/19 0819  Temp 36.5 C 12/05/19 0818  Pulse 87 12/05/19 0830  Resp 17 12/05/19 0830  SpO2 100 % 12/05/19 0830  Vitals shown include unvalidated device data.  Last Pain:  Vitals:   12/05/19 0818  TempSrc:   PainSc: Asleep         Complications: No apparent anesthesia complications

## 2019-12-06 LAB — CBC
HCT: 37.6 % — ABNORMAL LOW (ref 39.0–52.0)
Hemoglobin: 12.4 g/dL — ABNORMAL LOW (ref 13.0–17.0)
MCH: 29.4 pg (ref 26.0–34.0)
MCHC: 33 g/dL (ref 30.0–36.0)
MCV: 89.1 fL (ref 80.0–100.0)
Platelets: 330 10*3/uL (ref 150–400)
RBC: 4.22 MIL/uL (ref 4.22–5.81)
RDW: 11.9 % (ref 11.5–15.5)
WBC: 12.5 10*3/uL — ABNORMAL HIGH (ref 4.0–10.5)
nRBC: 0 % (ref 0.0–0.2)

## 2019-12-06 LAB — CREATININE, SERUM
Creatinine, Ser: 0.76 mg/dL (ref 0.61–1.24)
GFR calc Af Amer: >60 mL/min (ref >60)
GFR calc non Af Amer: >60 mL/min (ref >60)

## 2019-12-06 MED ORDER — KETOROLAC TROMETHAMINE 15 MG/ML IJ SOLN
15.0000 mg | Freq: Four times a day (QID) | INTRAMUSCULAR | Status: DC | PRN
  Administered 2019-12-07: 09:00:00 15 mg via INTRAVENOUS
  Filled 2019-12-06: qty 1

## 2019-12-06 MED ORDER — IBUPROFEN 400 MG PO TABS
400.0000 mg | ORAL_TABLET | Freq: Four times a day (QID) | ORAL | Status: DC
  Administered 2019-12-06 – 2019-12-07 (×4): 400 mg via ORAL
  Filled 2019-12-06 (×4): qty 1

## 2019-12-06 NOTE — Progress Notes (Signed)
[place] Hospital Day(s): 2.   Post op day(s): 1 Day Post-Op.   Interval History:  Patient seen and examined no acute events or new complaints overnight.  Patient reports pain is improved compared to presentation No fever, chills, nausea, or emesis Leukocytosis improved to 12K this morning Cultures still pending; On Vancomycin No other complaints  Vital signs in last 24 hours: [min-max] current  Temp:  [97.7 F (36.5 C)-98.7 F (37.1 C)] 98.3 F (36.8 C) (05/04 2355) Pulse Rate:  [62-85] 77 (05/04 2355) Resp:  [13-19] 18 (05/04 2355) BP: (101-133)/(52-81) 101/52 (05/04 2355) SpO2:  [97 %-100 %] 100 % (05/04 2355)     Height: 6\' 1"  (185.4 cm) Weight: 74.8 kg BMI (Calculated): 21.77   Intake/Output last 2 shifts:  05/04 0701 - 05/05 0700 In: 1120 [P.O.:120; I.V.:750; IV Piggyback:250] Out: 978 [Urine:975; Blood:3]   Physical Exam:  Constitutional: alert, cooperative and no distress  Respiratory: breathing non-labored at rest  Cardiovascular: regular rate and sinus rhythm  Integumentary: Right forearm wound, underlying tissue appears healthy and pink, no purulence or fluctuance, surrounding erythema markedly improved, there is still some induration surrounding the wound. Wound measurement: Wound is approximately 15 cm in length 3 cm in depth, 1 cm of undermining medially, and 3 cm wide   Right Forearm (12/06/2019):     Labs:  CBC Latest Ref Rng & Units 12/05/2019 12/04/2019 11/22/2018  WBC 4.0 - 10.5 K/uL 14.5(H) 16.3(H) 8.1  Hemoglobin 13.0 - 17.0 g/dL 11.8(L) 12.1(L) 13.4  Hematocrit 39.0 - 52.0 % 35.3(L) 34.8(L) 40.1  Platelets 150 - 400 K/uL 272 270 181   CMP Latest Ref Rng & Units 12/06/2019 12/05/2019 12/04/2019  Glucose 70 - 99 mg/dL - 117(H) 108(H)  BUN 6 - 20 mg/dL - 9 9  Creatinine 0.61 - 1.24 mg/dL 0.76 0.82 0.85  Sodium 135 - 145 mmol/L - 137 134(L)  Potassium 3.5 - 5.1 mmol/L - 4.0 3.8  Chloride 98 - 111 mmol/L - 105  99  CO2 22 - 32 mmol/L - 26 27  Calcium 8.9 - 10.3 mg/dL - 8.4(L) 8.5(L)  Total Protein 6.5 - 8.1 g/dL - - -  Total Bilirubin 0.3 - 1.2 mg/dL - - -  Alkaline Phos 38 - 126 U/L - - -  AST 15 - 41 U/L - - -  ALT 0 - 44 U/L - - -    Imaging studies: No new pertinent imaging studies   Assessment/Plan:  24 y.o. male with improved leukocytosis and significantly improved erythema 1 Day Post-Op s/p incision and drainage for right forearm abscess, complicated by pertinent comorbidities including IVDA.   - Continue daily + PRN dressing changes:   1) Pack with 1/4' gauze   2) Cover with Vaseline gauze   3) Cover in Kerlix +/- ACE wrap and secure  - Continue ABx (Vancomycin); narrow for discharge; pending cultures   - Pain control prn  - Monitor leukocytosis; improving   - We should consider home health to assist in wound care; I will order this  - Further management per primary service; we will follow   - Discharge Planning: He will benefit from at least 48 hours of IV Abx total; transition to PO at home. Also should attempt to arrange home health prior to discharge. He will ultimately follow up with general surgery in 2 weeks  All of the above findings and recommendations were discussed with the patient, and the medical team, and all of patient's  questions were answered to his expressed satisfaction.  -- Lynden Oxford, PA-C [facility] 12/06/2019, 7:11 AM [phone] M-F: 7am - 4pm

## 2019-12-06 NOTE — Progress Notes (Addendum)
[facility]  - [place] at [facility]   PATIENT NAME: James Holloway    MR#:  [id number]  DATE OF BIRTH:  [birth year]  SUBJECTIVE:  right upper extremity surgical dressing present on the forearm. Surrounding cellulitis improving. No fever. REVIEW OF SYSTEMS:   Review of Systems  Constitutional: Negative for chills, fever and weight loss.  HENT: Negative for ear discharge, ear pain and nosebleeds.   Eyes: Negative for blurred vision, pain and discharge.  Respiratory: Negative for sputum production, shortness of breath, wheezing and stridor.   Cardiovascular: Negative for chest pain, palpitations, orthopnea and PND.  Gastrointestinal: Negative for abdominal pain, diarrhea, nausea and vomiting.  Genitourinary: Negative for frequency and urgency.  Musculoskeletal: Positive for joint pain. Negative for back pain.  Neurological: Negative for sensory change, speech change, focal weakness and weakness.  Psychiatric/Behavioral: Negative for depression and hallucinations. The patient is not nervous/anxious.    Tolerating Diet:yes Tolerating PT: not needed  DRUG ALLERGIES:  No Known Allergies  VITALS:  Blood pressure 116/72, pulse 75, temperature 98.2 F (36.8 C), temperature source Oral, resp. rate 14, height 6\' 1"  (1.854 m), weight 74.8 kg, SpO2 100 %.  PHYSICAL EXAMINATION:   Physical Exam  GENERAL:  24 y.o.-year-old patient lying in the bed with no acute distress.  EYES: Pupils equal, round, reactive to light and accommodation. No scleral icterus.   HEENT: Head atraumatic, normocephalic. Oropharynx and nasopharynx clear.  NECK:  Supple, no jugular venous distention. No thyroid enlargement, no tenderness.  LUNGS: Normal breath sounds bilaterally, no wheezing, rales, rhonchi. No use of accessory muscles of respiration.  CARDIOVASCULAR: S1, S2 normal. No murmurs, rubs, or gallops.  ABDOMEN: Soft, nontender, nondistended. Bowel sounds present. No organomegaly or  mass.  EXTREMITIES: right upper extremity on 12/06/2019     NEUROLOGIC: Cranial nerves II through XII are intact. No focal Motor or sensory deficits b/l.   PSYCHIATRIC:  patient is alert and oriented x 3.  SKIN: No obvious rash, lesion, or ulcer.   LABORATORY PANEL:  CBC Recent Labs  Lab 12/06/19 0403  WBC 12.5*  HGB 12.4*  HCT 37.6*  PLT 330    Chemistries  Recent Labs  Lab 12/05/19 0437 12/05/19 0437 12/06/19 0403  NA 137  --   --   K 4.0  --   --   CL 105  --   --   CO2 26  --   --   GLUCOSE 117*  --   --   BUN 9  --   --   CREATININE 0.82   < > 0.76  CALCIUM 8.4*  --   --    < > = values in this interval not displayed.   Cardiac Enzymes No results for input(s): TROPONINI in the last 168 hours. RADIOLOGY:  No results found. ASSESSMENT AND PLAN:   24 year old man PMH IV drug use presented with redness and swelling right forearm status post injecting methamphetamine into the right forearm 3 days prior.  Right forearm ultrasound showed abscess. Admitted for sepsis secondary to right forearm abscess and surgical intervention.  Sepsis secondary to right forearm abscess --Clinically improving-- sepsis resolved - status post OR incision and drainage right forearm abscess 5/3.   -Continue IV vanc and de-escalate tomorrow once final results available.   -Further management in conjunction with surgery-- patient will need daily surgical dressing change. I have asked him to have one family member present tomorrow to learn seen changes. -Ibuprofen TID with meals -IV PRN Toradol  IV drug abuse --TOC consultation -- patient advised to abstain from IV drug abuse  Suspect opioid use disorder --Continue Suboxone-- patient goes to [place] clinic in [place]  Disposition Plan:   Status is: Inpatient  Remains inpatient appropriate because:IV treatments appropriate due to intensity of illness or inability to take PO   Dispo: The patient is from: Home   Anticipated d/c is to: Home  Anticipated d/c date is: 1 days ie may 6th  Patient currently is not medically stable to d/c-- currently requiring IV antibiotics and awaiting final culture results from the abscess to determine oral antibiotic.      TOTAL TIME TAKING CARE OF THIS PATIENT: *30* minutes.  >50% time spent on counselling and coordination of care  Note: This dictation was prepared with Dragon dictation along with smaller phrase technology. Any transcriptional errors that result from this process are unintentional.  Fritzi Mandes M.D    [facility]   CC: Primary care physician; Patient, No Pcp PerPatient ID: James Holloway, male   DOB: [birth year], 24 y.o.   MRN: [id number]

## 2019-12-06 NOTE — TOC Initial Note (Addendum)
Transition of Care [facility]) - Initial/Assessment Note    Patient Details  Name: James Holloway MRN: [id number] Date of Birth: 12/01/1995  [facility]) CM/SW Contact:    Elease Hashimoto, LCSW Phone Number: 12/06/2019, 2:09 PM  Clinical Narrative:    Met with pt to discuss discharge needs. He is aware of needing a HHRN for reinforced dressing changes on his arm due to cellulitis. His mom and girlfriend are coming in for education tomorrow. Referral made to [facility]. Will have meds called into to [facility] and [facility] referral made. He wants to quit drugs and has information on this. Pt voiced he is independent prior to admission and does not have a PCP, connected with [facility]. See Mom tomorrow regarding any questions tomorrow prior to discharge.  He is to follow up with [facility] due to was discharged 4/23.              Expected Discharge Plan: [place] Barriers to Discharge: Inadequate or no insurance, Continued Medical Work up   Patient Goals and CMS Choice Patient states their goals for this hospitalization and ongoing recovery are:: Going home with Mom and Dad CMS Medicare.gov Compare Post Acute Care list provided to:: Patient Choice offered to / list presented to : Patient  Expected Discharge Plan and Services Expected Discharge Plan: [place] In-house Referral: Clinical Social Work Discharge Planning Services: Medication Assistance, [place] Acute Care Choice: [employer] arrangements for the past 2 months: Single Family Home                           HH Arranged: RN [place] Agency: [facility] [employer]) Date [place]: 12/06/19 Time [place]: 49 Representative spoke with at [place]: [place] Arrangements/Services Living arrangements for the past 2 months: [place] Lives with:: Parents Patient language and need for  interpreter reviewed:: No Do you feel safe going back to the place where you live?: Yes      Need for Family Participation in Patient Care: Yes (Comment) Care giver support system in place?: Yes (comment)   Criminal Activity/Legal Involvement Pertinent to Current Situation/Hospitalization: No - Comment as needed  Activities of Daily Living Home Assistive Devices/Equipment: None ADL Screening (condition at time of admission) Patient's cognitive ability adequate to safely complete daily activities?: Yes Is the patient deaf or have difficulty hearing?: No Does the patient have difficulty seeing, even when wearing glasses/contacts?: No Does the patient have difficulty concentrating, remembering, or making decisions?: No Patient able to express need for assistance with ADLs?: Yes Does the patient have difficulty dressing or bathing?: No Independently performs ADLs?: Yes (appropriate for developmental age) Communication: Independent Dressing (OT): Independent Grooming: Independent Feeding: Independent Bathing: Independent Toileting: Independent In/Out Bed: Independent Walks in Home: Independent Does the patient have difficulty walking or climbing stairs?: No Weakness of Legs: None Weakness of Arms/Hands: None  Permission Sought/Granted Permission sought to share information with : Family Supports, Chartered certified accountant granted to share information with : Yes, Verbal Permission Granted  Share Information with NAME: Parents  Permission granted to share info w AGENCY: [facility]     Permission granted to share info w Contact Information: Helene Kelp  Emotional Assessment Appearance:: Appears stated age Attitude/Demeanor/Rapport: Engaged Affect (typically observed): Adaptable Orientation: : Oriented to Place, Oriented to  Time, Oriented to  Situation, Oriented to Self Alcohol / Substance Use: Illicit Drugs Psych Involvement: No (comment)  Admission diagnosis:   Cellulitis of right upper extremity [L03.113] Abscess of right upper extremity [L02.413] Sepsis, due to unspecified organism, unspecified whether acute organ dysfunction present [facility]) [A41.9] Patient Active Problem List   Diagnosis Date Noted  . Abscess of right forearm 12/04/2019  . Abscess of right upper extremity 12/04/2019  . Sepsis ([place]) 12/04/2019  . Opiate overdose ([place]) 11/23/2018  . Suicide attempt by heroin overdose ([place]) 11/22/2018  . Substance induced mood disorder ([place]) 11/22/2018  . Opioid abuse ([place]) 11/22/2018  . MDD (major depressive disorder), recurrent severe, without psychosis ([place]) 11/22/2018   PCP:  Patient, No Pcp Per Pharmacy:   [id number] [place] [address] [id number]: [phone]: [phone] [id number]: [phone]: [phone]    Social Determinants of Health (SDOH) Interventions    Readmission Risk Interventions No flowsheet data found.

## 2019-12-07 LAB — CREATININE, SERUM
Creatinine, Ser: 0.76 mg/dL (ref 0.61–1.24)
GFR calc Af Amer: >60 mL/min (ref >60)
GFR calc non Af Amer: >60 mL/min (ref >60)

## 2019-12-07 MED ORDER — IBUPROFEN 400 MG PO TABS: 400.0000 mg | ORAL_TABLET | Freq: Three times a day (TID) | ORAL | 0 refills | Status: AC

## 2019-12-07 MED ORDER — SULFAMETHOXAZOLE-TRIMETHOPRIM 800-160 MG PO TABS: 1.0000 | ORAL_TABLET | Freq: Two times a day (BID) | ORAL | Status: DC

## 2019-12-07 MED ORDER — SULFAMETHOXAZOLE-TRIMETHOPRIM 800-160 MG PO TABS: 1.0000 | ORAL_TABLET | Freq: Two times a day (BID) | ORAL | 0 refills | Status: AC

## 2019-12-07 NOTE — Discharge Summary (Addendum)
[facility] NAME: James Holloway    MR#:  [id number]  DATE OF BIRTH:  04/20/1996  DATE OF ADMISSION:  12/04/2019 ADMITTING PHYSICIAN: Collier Bullock, MD  DATE OF DISCHARGE: 12/07/2019 PRIMARY CARE PHYSICIAN: Patient, No Pcp Per    ADMISSION DIAGNOSIS:  Cellulitis of right upper extremity [L03.113] Abscess of right upper extremity [L02.413] Sepsis, due to unspecified organism, unspecified whether acute organ dysfunction present ([place]) [A41.9]  DISCHARGE DIAGNOSIS:  Sepsis on admission--resolved Right forearm Abscess--s/p I and D IVDA  SECONDARY DIAGNOSIS:   Past Medical History:  Diagnosis Date   Depression    Opioid dependence ([place])    heroin abuse    HOSPITAL COURSE:   24 year old man PMH IV drug use presented with redness and swelling right forearm status post injecting methamphetamine into the right forearm 3 days prior. Right forearm ultrasound showed abscess. Admitted for sepsis secondary to right forearm abscess and surgical intervention.  Sepsis secondary to right forearm abscess --Clinically improving-- sepsis resolved - status post OR incision and drainage right forearm abscess 5/3.  -received  IV vanc and de-escalate to po Bactrim (total 10 days) -WC few staph aureus--called lab and will have fine results by later today or tomorrow -patient will need daily surgical dressing change. Pt's GF here to learn dressing changes. -Ibuprofen TID with meals -overall improving. No fever. White count trending down. Swelling and inflammation trending down. -- Discussed with surgery PA okay to go home and follow-up 1 to 2 weeks for postop wound check. Patient advised follow-up with surgery. He voiced understanding. Girlfriend in the room.  Addendum: lab called with wound culture results of MRSA resistant to Bactrim. Called [facility] [address] and discontinued Bactrim.  Prescription for clindamycin 600 mg TID for eight  days was called in.  IV drug abuse --TOC consultation -- patient advised to abstain from IV drug abuse  Suspect opioid use disorder --Continue Suboxone-- patient goes to [facility] [place]  Disposition Plan:  Status is: Inpatient Dispo: The patient is from:Home Anticipated d/c is ML:YYTK Anticipated d/c date is:  may 6th Patient currentlyis  medically stable to d/c CONSULTS OBTAINED:  Treatment Team:  Ronny Bacon, MD  DRUG ALLERGIES:  No Known Allergies  DISCHARGE MEDICATIONS:   Allergies as of 12/07/2019   No Known Allergies     Medication List    TAKE these medications   Buprenorphine HCl-Naloxone HCl 8-2 MG Film Place 1 Film under the tongue daily.   ibuprofen 400 MG tablet Commonly known as: ADVIL Take 1 tablet (400 mg total) by mouth 3 (three) times daily. For 2-3 days then as needed   sulfamethoxazole-trimethoprim 800-160 MG tablet Commonly known as: BACTRIM DS Take 1 tablet by mouth every 12 (twelve) hours for 8 days.       If you experience worsening of your admission symptoms, develop shortness of breath, life threatening emergency, suicidal or homicidal thoughts you must seek medical attention immediately by calling 911 or calling your MD immediately  if symptoms less severe.  You Must read complete instructions/literature along with all the possible adverse reactions/side effects for all the Medicines you take and that have been prescribed to you. Take any new Medicines after you have completely understood and accept all the possible adverse reactions/side effects.   Please note  You were cared for by a hospitalist during your hospital stay. If you have any questions about your discharge medications or the care you received while  you were in the hospital after you are discharged, you can call the unit and asked to speak with the hospitalist on call if the hospitalist that took care of you is not  available. Once you are discharged, your primary care physician will handle any further medical issues. Please note that NO REFILLS for any discharge medications will be authorized once you are discharged, as it is imperative that you return to your primary care physician (or establish a relationship with a primary care physician if you do not have one) for your aftercare needs so that they can reassess your need for medications and monitor your lab values. Today   SUBJECTIVE   No new complaints  VITAL SIGNS:  Blood pressure 111/63, pulse (!) 57, temperature 98.3 F (36.8 C), temperature source Oral, resp. rate 14, height 6\' 1"  (1.854 m), weight 74.8 kg, SpO2 100 %.  I/O:    Intake/Output Summary (Last 24 hours) at 12/07/2019 0907 Last data filed at 12/07/2019 0200 Gross per 24 hour  Intake 240 ml  Output 300 ml  Net -60 ml    PHYSICAL EXAMINATION:  GENERAL:  24 y.o.-year-old patient lying in the bed with no acute distress.  EYES: Pupils equal, round, reactive to light and accommodation. No scleral icterus.  HEENT: Head atraumatic, normocephalic. Oropharynx and nasopharynx clear.  NECK:  Supple, no jugular venous distention. No thyroid enlargement, no tenderness.  LUNGS: Normal breath sounds bilaterally, no wheezing, rales,rhonchi or crepitation. No use of accessory muscles of respiration.  CARDIOVASCULAR: S1, S2 normal. No murmurs, rubs, or gallops.  ABDOMEN: Soft, non-tender, non-distended. Bowel sounds present. No organomegaly or mass.  EXTREMITIES: No pedal edema, cyanosis, or clubbing.    NEUROLOGIC: Cranial nerves II through XII are intact. Muscle strength 5/5 in all extremities. Sensation intact. Gait not checked.  PSYCHIATRIC: The patient is alert and oriented x 3.  SKIN: No obvious rash, lesion, or ulcer.   DATA REVIEW:   CBC  Recent Labs  Lab 12/06/19 0403  WBC 12.5*  HGB 12.4*  HCT 37.6*  PLT 330    Chemistries  Recent Labs  Lab 12/05/19 0437 12/06/19 0403  12/07/19 0429  NA 137  --   --   K 4.0  --   --   CL 105  --   --   CO2 26  --   --   GLUCOSE 117*  --   --   BUN 9  --   --   CREATININE 0.82   < > 0.76  CALCIUM 8.4*  --   --    < > = values in this interval not displayed.    Microbiology Results   Recent Results (from the past 240 hour(s))  Culture, blood (routine x 2)     Status: None (Preliminary result)   Collection Time: 12/04/19  8:51 AM   Specimen: BLOOD  Result Value Ref Range Status   Specimen Description BLOOD BLOOD RIGHT HAND  Final   Special Requests   Final    BOTTLES DRAWN AEROBIC AND ANAEROBIC Blood Culture adequate volume   Culture   Final    NO GROWTH 3 DAYS Performed at [facility], [address] [place], [place]    Report Status PENDING  Incomplete  Culture, blood (routine x 2)     Status: None (Preliminary result)   Collection Time: 12/04/19  9:33 AM   Specimen: BLOOD  Result Value Ref Range Status   Specimen Description BLOOD BLOOD LEFT FOREARM  Final   Special Requests   Final    BOTTLES DRAWN AEROBIC AND ANAEROBIC Blood Culture results may not be optimal due to an excessive volume of blood received in culture bottles   Culture   Final    NO GROWTH 3 DAYS Performed at [facility], [address], [place], [place] [address]    Report Status PENDING  Incomplete  Respiratory Panel by RT PCR (Flu A&B, Covid) - Nasopharyngeal Swab     Status: None   Collection Time: 12/04/19  9:37 AM   Specimen: Nasopharyngeal Swab  Result Value Ref Range Status   SARS Coronavirus 2 by RT PCR NEGATIVE NEGATIVE Final    Comment: (NOTE) SARS-CoV-2 target nucleic acids are NOT DETECTED. The SARS-CoV-2 RNA is generally detectable in upper respiratoy specimens during the acute phase of infection. The lowest concentration of SARS-CoV-2 viral copies this assay can detect is 131 copies/mL. A negative result does not preclude SARS-Cov-2 infection and should not be used as the sole basis  for treatment or other patient management decisions. A negative result may occur with  improper specimen collection/handling, submission of specimen other than nasopharyngeal swab, presence of viral mutation(s) within the areas targeted by this assay, and inadequate number of viral copies (<131 copies/mL). A negative result must be combined with clinical observations, patient history, and epidemiological information. The expected result is Negative. Fact Sheet for Patients:  https://www.moore.com/ Fact Sheet for Healthcare Providers:  https://www.young.biz/ This test is not yet ap proved or cleared by the [place] FDA and  has been authorized for detection and/or diagnosis of SARS-CoV-2 by FDA under an Emergency Use Authorization (EUA). This EUA will remain  in effect (meaning this test can be used) for the duration of the COVID-19 declaration under Section 564(b)(1) of the Act, 21 U.S.C. section 360bbb-3(b)(1), unless the authorization is terminated or revoked sooner.    Influenza A by PCR NEGATIVE NEGATIVE Final   Influenza B by PCR NEGATIVE NEGATIVE Final    Comment: (NOTE) The Xpert Xpress SARS-CoV-2/FLU/RSV assay is intended as an aid in  the diagnosis of influenza from Nasopharyngeal swab specimens and  should not be used as a sole basis for treatment. Nasal washings and  aspirates are unacceptable for Xpert Xpress SARS-CoV-2/FLU/RSV  testing. Fact Sheet for Patients: https://www.moore.com/ Fact Sheet for Healthcare Providers: https://www.young.biz/ This test is not yet approved or cleared by the [place] FDA and  has been authorized for detection and/or diagnosis of SARS-CoV-2 by  FDA under an Emergency Use Authorization (EUA). This EUA will remain  in effect (meaning this test can be used) for the duration of the  Covid-19 declaration under Section 564(b)(1) of the Act, 21  U.S.C.  section 360bbb-3(b)(1), unless the authorization is  terminated or revoked. Performed at [facility], [address] [place], [place] [address]   Aerobic/Anaerobic Culture (surgical/deep wound)     Status: None (Preliminary result)   Collection Time: 12/05/19  7:50 AM   Specimen: PATH Other; Tissue  Result Value Ref Range Status   Specimen Description   Final    ABSCESS Performed at [facility], [address], [place], [place] [address]    Special Requests   Final    NONE Performed at [facility], [address] [place], [place] [address]    Gram Stain   Final    RARE WBC PRESENT, PREDOMINANTLY PMN NO ORGANISMS SEEN    Culture   Final    FEW STAPHYLOCOCCUS AUREUS  SUSCEPTIBILITIES TO FOLLOW CULTURE REINCUBATED FOR BETTER GROWTH Performed at [facility], [address] [place], [place] [address]    Report Status PENDING  Incomplete    RADIOLOGY:  No results found.   CODE STATUS:     Code Status Orders  (From admission, onward)         Start     Ordered   12/04/19 1615  Full code  Continuous     12/04/19 1616        Code Status History    Date Active Date Inactive Code Status Order ID Comments User Context   12/04/2019 1112 12/04/2019 1337 Full Code [id number]  Lucile Shutters, MD ED   11/22/2018 1526 11/24/2018 1908 Full Code [id number]  Mariel Craft, MD Inpatient   Advance Care Planning Activity       TOTAL TIME TAKING CARE OF THIS PATIENT: *40* minutes.    Enedina Finner M.D  [facility]    CC: Primary care physician; Patient, No Pcp Per

## 2019-12-07 NOTE — Progress Notes (Signed)
Patient ID: James Holloway, male   DOB: [birth year], 24 y.o.   MRN: [id number]  Lab called with WC results  That grew MRSA resistant to bactrim Will call clindamycin 600 mg tid for [facility] days [facility] [address]

## 2019-12-07 NOTE — Progress Notes (Signed)
[facility] SURGICAL PROGRESS NOTE  Hospital Day(s): 3.   Post op day(s): 2 Days Post-Op.   Interval History:  Patient seen and examined no acute events or new complaints overnight.  Patient reports he is feeling better this morning Less pain in the RUE No fever, chills Tolerating dressing changes; CSW following for home health No new complaints.   Vital signs in last 24 hours: [min-max] current  Temp:  [98.6 F (37 C)] 98.6 F (37 C) (05/05 2359) Pulse Rate:  [66-67] 66 (05/05 2359) Resp:  [16] 16 (05/05 2240) BP: (103-114)/(69-79) 114/69 (05/05 2359) SpO2:  [99 %-100 %] 99 % (05/05 2359)     Height: 6\' 1"  (185.4 cm) Weight: 74.8 kg BMI (Calculated): 21.77   Intake/Output last 2 shifts:  05/05 0701 - 05/06 0700 In: 240 [P.O.:240] Out: 300 [Urine:300]   Physical Exam:  Constitutional: alert, cooperative and no distress  Respiratory: breathing non-labored at rest  Cardiovascular: regular rate and sinus rhythm  Integumentary: Right forearm wound, underlying tissue appears healthy and pink, no purulence or fluctuance, surrounding erythema markedly improved, there is still some induration surrounding the wound. Wound measurement: Wound is approximately 15 cm in length 3 cm in depth, 1 cm of undermining medially, and 3 cm wide   Labs:  CBC Latest Ref Rng & Units 12/06/2019 12/05/2019 12/04/2019  WBC 4.0 - 10.5 K/uL 12.5(H) 14.5(H) 16.3(H)  Hemoglobin 13.0 - 17.0 g/dL 12.4(L) 11.8(L) 12.1(L)  Hematocrit 39.0 - 52.0 % 37.6(L) 35.3(L) 34.8(L)  Platelets 150 - 400 K/uL 330 272 270   CMP Latest Ref Rng & Units 12/07/2019 12/06/2019 12/05/2019  Glucose 70 - 99 mg/dL - - 02/04/2020)  BUN 6 - 20 mg/dL - - 9  Creatinine 161(W - 1.24 mg/dL 9.60 4.54 0.98  Sodium 135 - 145 mmol/L - - 137  Potassium 3.5 - 5.1 mmol/L - - 4.0  Chloride 98 - 111 mmol/L - - 105  CO2 22 - 32 mmol/L - - 26  Calcium 8.9 - 10.3 mg/dL - - 8.4(L)  Total Protein 6.5 - 8.1 g/dL - - -  Total Bilirubin 0.3 -  1.2 mg/dL - - -  Alkaline Phos 38 - 126 U/L - - -  AST 15 - 41 U/L - - -  ALT 0 - 44 U/L - - -     Imaging studies: No new pertinent imaging studies   Assessment/Plan:  24 y.o. male doing well 2 Days Post-Op s/p incision and drainage for right forearm abscess, complicated by pertinent comorbidities including IVDA.   - Continue daily + PRN dressing changes:                         1) Pack with 1/4' gauze                         2) Cover with Vaseline gauze                         3) Cover in Kerlix +/- ACE wrap and secure             - Continue ABx (Vancomycin); narrow for discharge; pending cultures              - Pain control prn             - Monitor leukocytosis; improving              -  CSW following for home health services             - Further management per primary service; we will follow              - Discharge Planning: Home today with 10 days ABx; follow up with me next week, okay for discharge from surgical standpoint, d/w medicine service  All of the above findings and recommendations were discussed with the patient, and the medical team, and all of patient's questions were answered to his expressed satisfaction.  -- Edison Simon, PA-C [facility] 12/07/2019, 7:28 AM [phone] M-F: 7am - 4pm

## 2019-12-07 NOTE — Discharge Instructions (Signed)
Continue daily + PRN dressing changes:              1) Pack with 1/4' gauze              2) Cover with Vaseline gauze              3) Cover in Kerlix +/- ACE wrap and secure

## 2019-12-07 NOTE — Plan of Care (Signed)

## 2019-12-07 NOTE — Progress Notes (Signed)
Patients wound care performed and demonstrated to patients girlfriend " James Holloway" , verbalized understanding , and had no questions , will give adequate supplies for wound care procedure , wet to dry dressing ,

## 2019-12-07 NOTE — TOC Transition Note (Signed)
Transition of Care [facility]) - CM/SW Discharge Note   Patient Details  Name: James Holloway MRN: [id number] Date of Birth: 24-May-1996  Transition of Care [facility]) CM/SW Contact:  Lucy Chris, LCSW Phone Number: 12/07/2019, 9:29 AM   Clinical Narrative:   Pt medically stable for discharge today. HHRN to see for a few visits to make sure family-girlfriend can do dressing changes. Girlfriend here and learning dressing changes. Pt aware of resources for drug abuse and will follow up with. Pt ready for discharge today. No further follow due to DC today.    Final next level of care: Home w Home Health Services Barriers to Discharge: Inadequate or no insurance   Patient Goals and CMS Choice Patient states their goals for this hospitalization and ongoing recovery are:: Going home with Mom and Dad CMS Medicare.gov Compare Post Acute Care list provided to:: Patient Choice offered to / list presented to : Patient  Discharge Placement                Patient to be transferred to facility by: Girlfriend via car Name of family member notified: Mom Patient and family notified of of transfer: 12/07/19  Discharge Plan and Services In-house Referral: Clinical Social Work Discharge Planning Services: Medication Assistance, Indigent Health Clinic Post Acute Care Choice: Home Health                    HH Arranged: RN [facility] Agency: [facility] Date HH Agency Contacted: 12/06/19 Time HH Agency Contacted: 1408 Representative spoke with at [facility] Agency: Rosey Bath  Social Determinants of Health (SDOH) Interventions     Readmission Risk Interventions No flowsheet data found.

## 2019-12-09 LAB — CULTURE, BLOOD (ROUTINE X 2)
Culture: NO GROWTH
Culture: NO GROWTH
Special Requests: ADEQUATE

## 2019-12-11 LAB — AEROBIC/ANAEROBIC CULTURE W GRAM STAIN (SURGICAL/DEEP WOUND)

## 2019-12-19 ENCOUNTER — Other Ambulatory Visit: Payer: Self-pay

## 2019-12-19 ENCOUNTER — Ambulatory Visit (INDEPENDENT_AMBULATORY_CARE_PROVIDER_SITE_OTHER): Payer: Self-pay | Admitting: Physician Assistant

## 2019-12-19 ENCOUNTER — Encounter: Payer: Self-pay | Admitting: Physician Assistant

## 2019-12-19 VITALS — BP 136/81 | HR 102 | Temp 95.7°F | Ht 73.0 in | Wt 174.6 lb

## 2019-12-19 DIAGNOSIS — Z09 Encounter for follow-up examination after completed treatment for conditions other than malignant neoplasm: Secondary | ICD-10-CM

## 2019-12-19 DIAGNOSIS — L02413 Cutaneous abscess of right upper limb: Secondary | ICD-10-CM

## 2019-12-19 NOTE — Progress Notes (Signed)
[facility] POST-OP OFFICE VISIT  12/19/2019  HPI: James Holloway is a 24 y.o. male 14 days s/p incision and drainage of right forearm abscess with Dr Claudine Mouton  Doing well No issues with dressing changes Completed ABx No fever, chills No further complaints  Vital signs: BP 136/81   Pulse (!) 102   Temp (!) 95.7 F (35.4 C) (Temporal)   Ht 6\' 1"  (1.854 m)   Wt 174 lb 9.6 oz (79.2 kg)   SpO2 93%   BMI 23.04 kg/m    Physical Exam: Constitutional: Well appearing male, NAD Skin: Right forearm abscess is healing well, wound bed is granulated, no more packing, no erythema or drainage  Assessment/Plan: This is a 24 y.o. male 14 days s/p incision and drainage of right forearm abscess   - No further abx  - continue dressing changes daily + prn  - follow up in 2 weeks if needed, if wound is healed an no issues okay to cancel appointment  -- 22, PA-C [facility] 12/19/2019, 11:21 AM [phone] M-F: 7am - 4pm

## 2022-04-14 IMAGING — US US EXTREM UP *R* LTD
1 series · 14 of 21 positions shown · non-contrast
Comparison: None.

CLINICAL DATA: Right forearm swelling and erythema.

EXAM:
ULTRASOUND RIGHT  UPPER EXTREMITY LIMITED
TECHNIQUE: Ultrasound examination of the upper extremity soft tissues was
performed in the area of clinical concern.

[Series 1: us soft tissue right upper extremity limited (non- · 21 acquisitions, 14 frames shown]
[im 1/21]
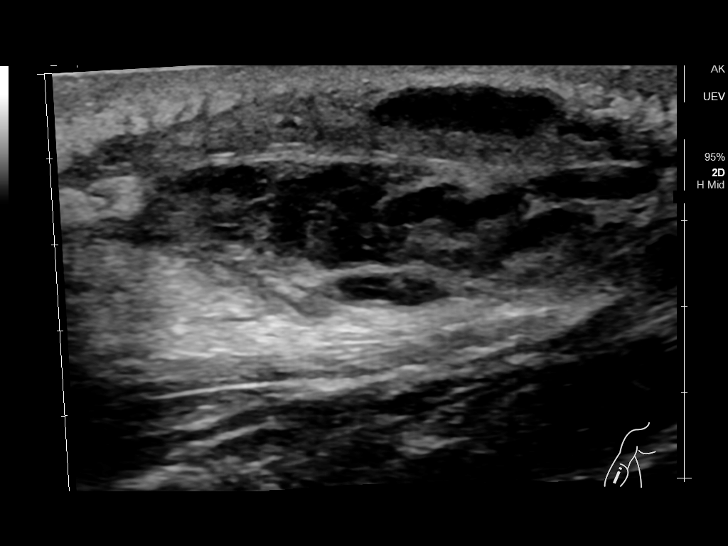
[im 3/21]
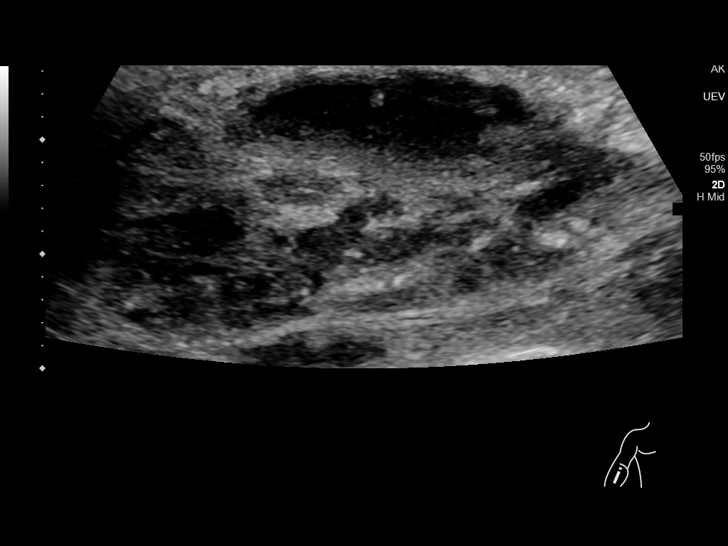
[im 4/21]
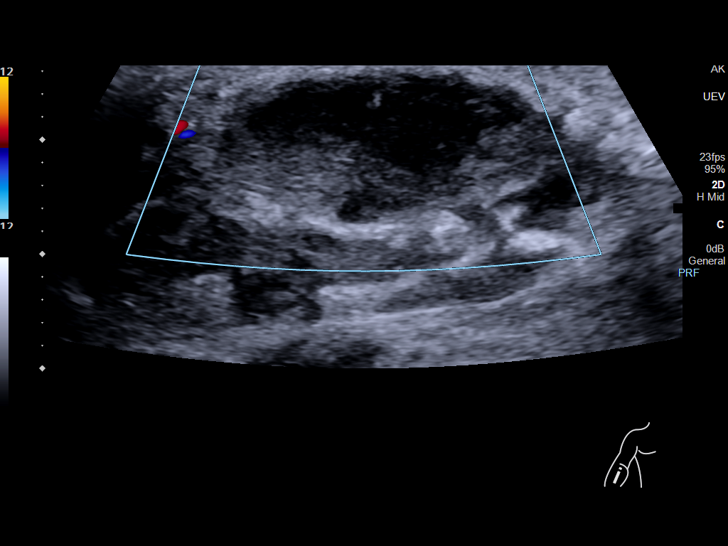
[im 6/21]
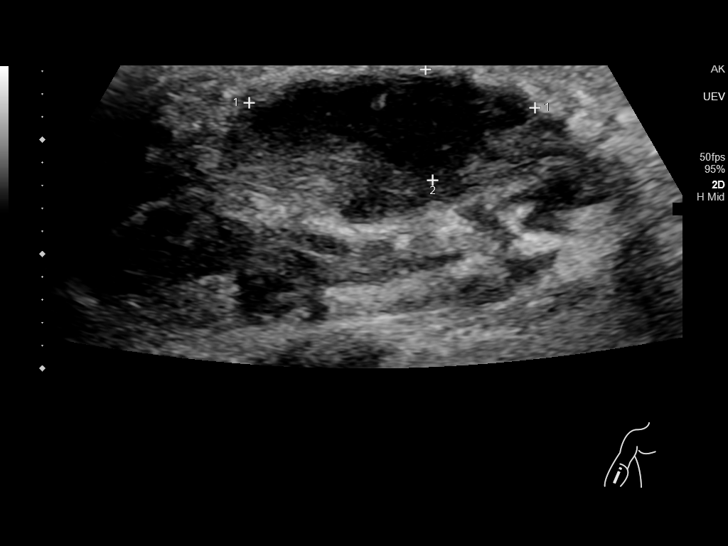
[im 7/21]
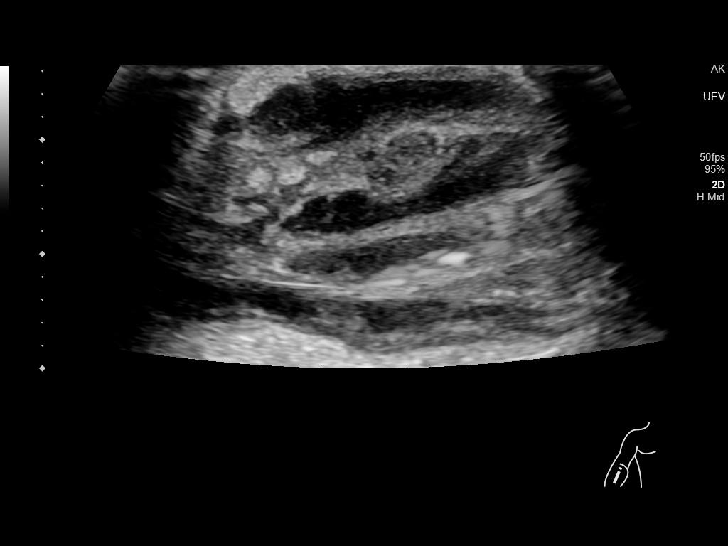
[im 9/21]
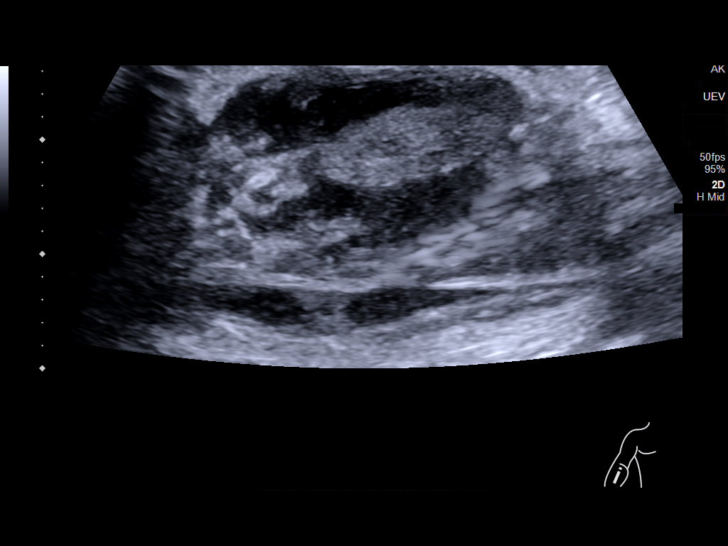
[im 10/21]
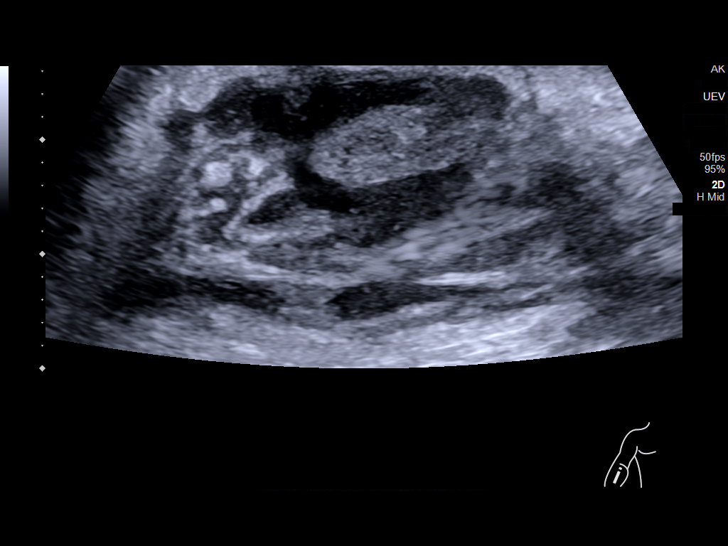
[im 12/21]
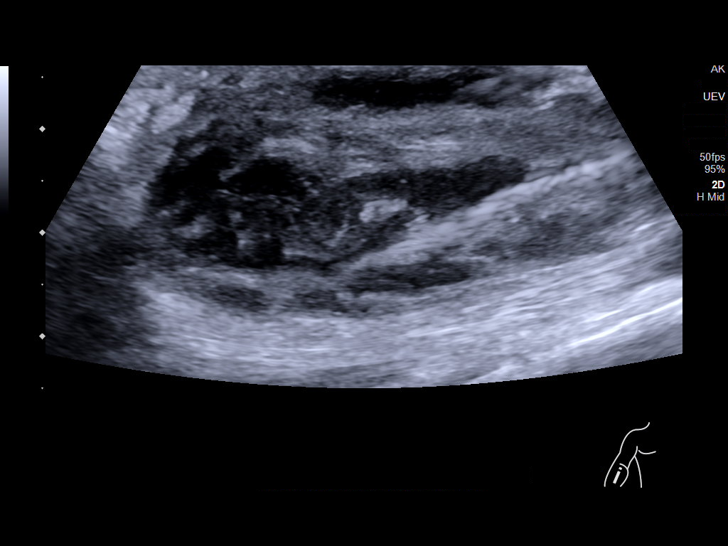
[im 13/21]
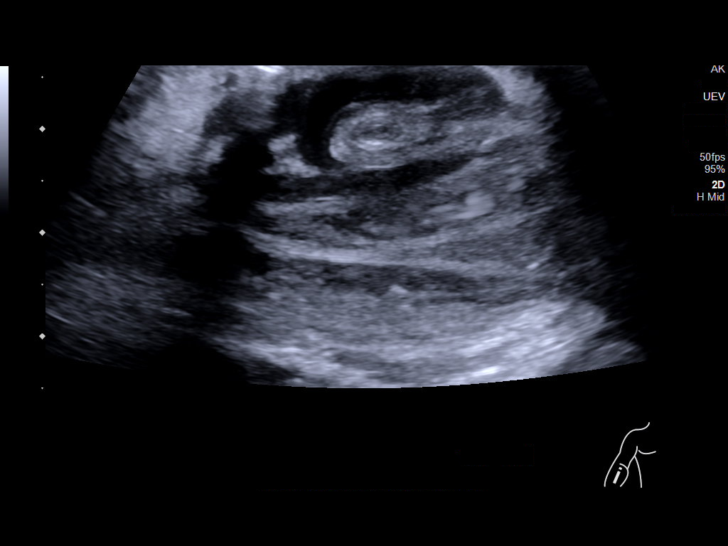
[im 15/21]
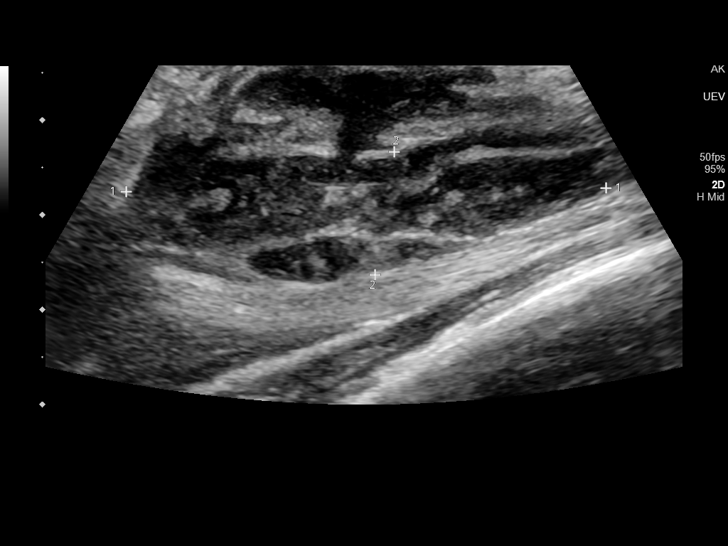
[im 16/21]
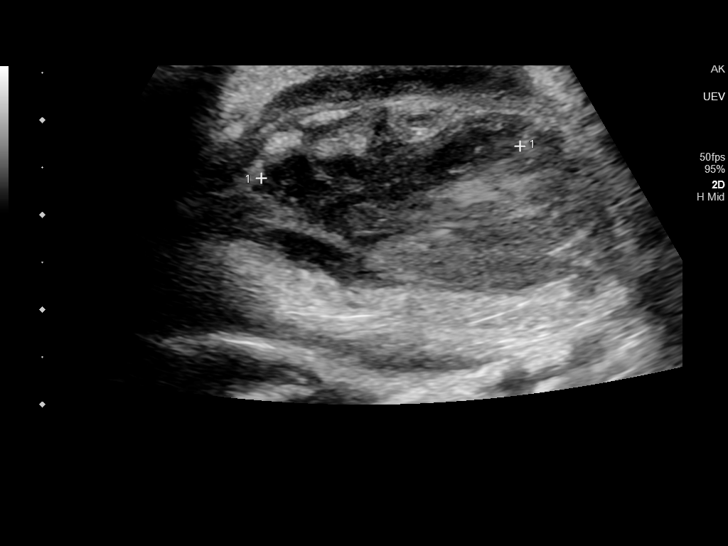
[im 18/21]
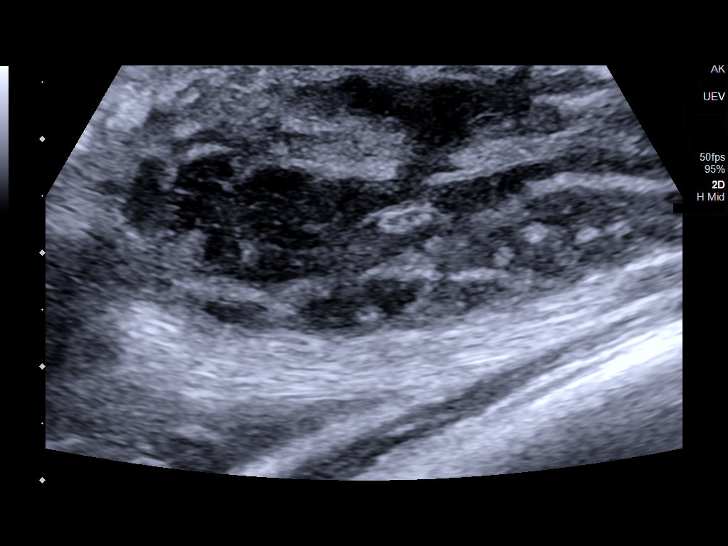
[im 19/21]
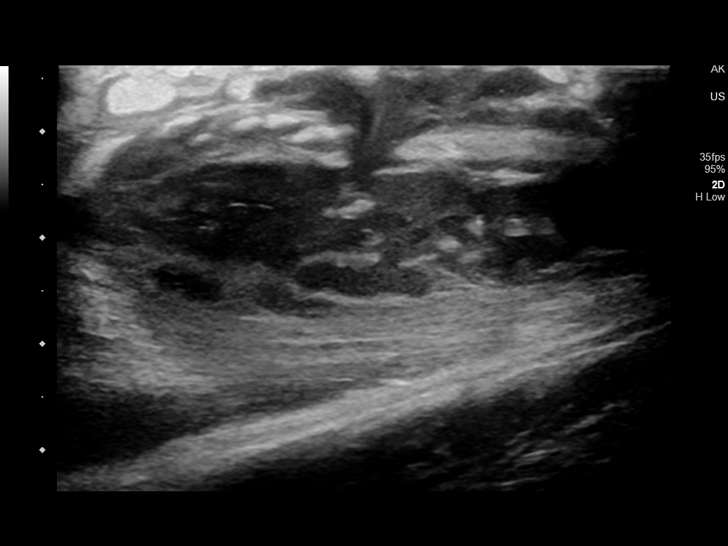
[im 21/21]
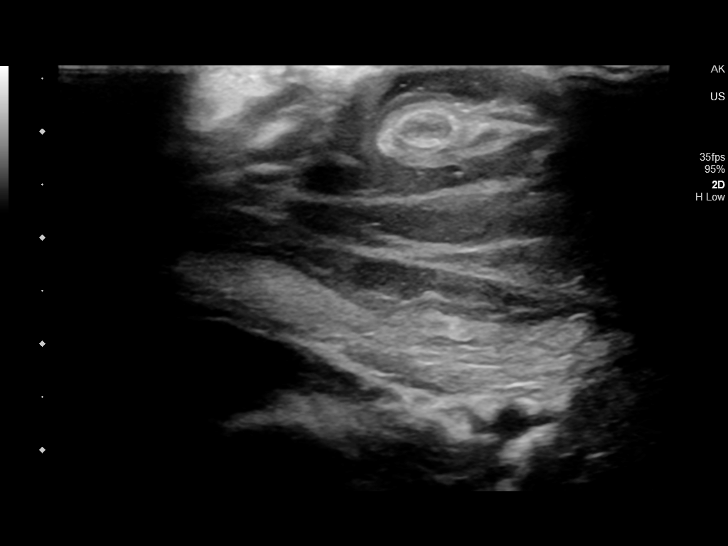

[14 of 21 positions shown; findings below may reference images not displayed]

FINDINGS: There is a poorly defined 5.1 x 1.3 x 2.8 cm complex fluid
collection in the subcutaneous fat of the forearm with a connecting
2.5 x 1.0 x 2.3 cm complex superficial fluid collection. There
appears to be a thrombosed vein running through these areas.
IMPRESSION: Poorly defined abscess in the subcutaneous soft tissues of the right
forearm surrounding a thrombosed vein.
